# Patient Record
Sex: Female | Born: 1940 | Race: Black or African American | Hispanic: No | Marital: Married | Smoking: Never smoker
Health system: Southern US, Community
[De-identification: ages and names within clinical notes are randomized; demographics above are authoritative.]

## PROBLEM LIST (undated history)

## (undated) DIAGNOSIS — R42 Dizziness and giddiness: Secondary | ICD-10-CM

## (undated) DIAGNOSIS — E7800 Pure hypercholesterolemia, unspecified: Secondary | ICD-10-CM

## (undated) DIAGNOSIS — I639 Cerebral infarction, unspecified: Secondary | ICD-10-CM

## (undated) DIAGNOSIS — K219 Gastro-esophageal reflux disease without esophagitis: Secondary | ICD-10-CM

## (undated) DIAGNOSIS — I10 Essential (primary) hypertension: Secondary | ICD-10-CM

## (undated) HISTORY — PX: CHOLECYSTECTOMY: SHX55

## (undated) HISTORY — PX: ABDOMINAL HYSTERECTOMY: SHX81

## (undated) HISTORY — PX: LEG SURGERY: SHX1003

---

## 2003-08-11 ENCOUNTER — Encounter (INDEPENDENT_AMBULATORY_CARE_PROVIDER_SITE_OTHER): Payer: Self-pay | Admitting: Specialist

## 2003-08-11 ENCOUNTER — Inpatient Hospital Stay (HOSPITAL_COMMUNITY): Admission: EM | Admit: 2003-08-11 | Discharge: 2003-08-12 | Payer: Self-pay | Admitting: Emergency Medicine

## 2006-10-15 DIAGNOSIS — E7800 Pure hypercholesterolemia, unspecified: Secondary | ICD-10-CM | POA: Diagnosis present

## 2006-10-15 DIAGNOSIS — K219 Gastro-esophageal reflux disease without esophagitis: Secondary | ICD-10-CM | POA: Diagnosis present

## 2006-10-15 DIAGNOSIS — I10 Essential (primary) hypertension: Secondary | ICD-10-CM | POA: Diagnosis present

## 2007-09-22 ENCOUNTER — Emergency Department (HOSPITAL_COMMUNITY): Admission: EM | Admit: 2007-09-22 | Discharge: 2007-09-22 | Payer: Self-pay | Admitting: Emergency Medicine

## 2010-05-25 ENCOUNTER — Ambulatory Visit: Payer: Self-pay | Admitting: Pulmonary Disease

## 2010-05-25 ENCOUNTER — Inpatient Hospital Stay (HOSPITAL_COMMUNITY)
Admission: EM | Admit: 2010-05-25 | Discharge: 2010-05-30 | Payer: Self-pay | Source: Home / Self Care | Admitting: Emergency Medicine

## 2010-05-25 ENCOUNTER — Ambulatory Visit: Payer: Self-pay | Admitting: Cardiology

## 2010-05-26 ENCOUNTER — Encounter (INDEPENDENT_AMBULATORY_CARE_PROVIDER_SITE_OTHER): Payer: Self-pay | Admitting: Neurology

## 2010-05-26 ENCOUNTER — Ambulatory Visit: Payer: Self-pay | Admitting: Cardiology

## 2010-05-27 ENCOUNTER — Ambulatory Visit: Payer: Self-pay | Admitting: Physical Medicine & Rehabilitation

## 2010-05-27 ENCOUNTER — Encounter (INDEPENDENT_AMBULATORY_CARE_PROVIDER_SITE_OTHER): Payer: Self-pay | Admitting: Neurology

## 2010-06-06 ENCOUNTER — Encounter
Admission: RE | Admit: 2010-06-06 | Discharge: 2010-09-04 | Payer: Self-pay | Source: Home / Self Care | Admitting: Nurse Practitioner

## 2010-06-17 ENCOUNTER — Ambulatory Visit: Payer: Self-pay

## 2010-06-17 ENCOUNTER — Encounter: Payer: Self-pay | Admitting: Internal Medicine

## 2010-07-07 DIAGNOSIS — Z8673 Personal history of transient ischemic attack (TIA), and cerebral infarction without residual deficits: Secondary | ICD-10-CM

## 2010-11-08 NOTE — Procedures (Signed)
Summary: Summary Report  Summary Report   Imported By: Erle Crocker 07/19/2010 13:24:56  _____________________________________________________________________  External Attachment:    Type:   Image     Comment:   External Document

## 2010-12-22 LAB — URINALYSIS, ROUTINE W REFLEX MICROSCOPIC
Bilirubin Urine: NEGATIVE
Glucose, UA: NEGATIVE mg/dL
Hgb urine dipstick: NEGATIVE
Ketones, ur: NEGATIVE mg/dL
Nitrite: NEGATIVE
Protein, ur: NEGATIVE mg/dL
Specific Gravity, Urine: 1.01 (ref 1.005–1.030)
Urobilinogen, UA: 1 mg/dL (ref 0.0–1.0)
pH: 7 (ref 5.0–8.0)

## 2010-12-22 LAB — BASIC METABOLIC PANEL
BUN: 10 mg/dL (ref 6–23)
CO2: 24 mEq/L (ref 19–32)
CO2: 26 mEq/L (ref 19–32)
Calcium: 8.4 mg/dL (ref 8.4–10.5)
Chloride: 106 mEq/L (ref 96–112)
Creatinine, Ser: 1 mg/dL (ref 0.4–1.2)
GFR calc Af Amer: >60 mL/min (ref >60)
GFR calc non Af Amer: 55 mL/min — ABNORMAL LOW (ref >60)
GFR calc non Af Amer: >60 mL/min (ref >60)
Glucose, Bld: 110 mg/dL — ABNORMAL HIGH (ref 70–99)
Glucose, Bld: 139 mg/dL — ABNORMAL HIGH (ref 70–99)
Potassium: 3.5 mEq/L (ref 3.5–5.1)
Potassium: 4.1 mEq/L (ref 3.5–5.1)
Sodium: 137 mEq/L (ref 135–145)
Sodium: 140 mEq/L (ref 135–145)

## 2010-12-22 LAB — BLOOD GAS, ARTERIAL
Acid-base deficit: 1 mmol/L (ref 0.0–2.0)
Bicarbonate: 23.6 mEq/L (ref 20.0–24.0)
Drawn by: 31843
FIO2: 0.5 %
MECHVT: 500 mL
O2 Saturation: 98.9 %
PEEP: 5 cmH2O
Patient temperature: 97.6
RATE: 14 resp/min
TCO2: 24.9 mmol/L (ref 0–100)
pCO2 arterial: 41 mmHg (ref 35.0–45.0)
pH, Arterial: 7.375 (ref 7.350–7.400)
pO2, Arterial: 146 mmHg — ABNORMAL HIGH (ref 80.0–100.0)

## 2010-12-22 LAB — CBC
HCT: 29.7 % — ABNORMAL LOW (ref 36.0–46.0)
HCT: 31.7 % — ABNORMAL LOW (ref 36.0–46.0)
HCT: 38.6 % (ref 36.0–46.0)
Hemoglobin: 10.4 g/dL — ABNORMAL LOW (ref 12.0–15.0)
Hemoglobin: 13 g/dL (ref 12.0–15.0)
Hemoglobin: 9.7 g/dL — ABNORMAL LOW (ref 12.0–15.0)
MCH: 29.5 pg (ref 26.0–34.0)
MCH: 29.9 pg (ref 26.0–34.0)
MCHC: 32.7 g/dL (ref 30.0–36.0)
MCHC: 33.1 g/dL (ref 30.0–36.0)
MCHC: 33.7 g/dL (ref 30.0–36.0)
MCV: 88.7 fL (ref 78.0–100.0)
MCV: 89 fL (ref 78.0–100.0)
Platelets: 224 10*3/uL (ref 150–400)
Platelets: 286 10*3/uL (ref 150–400)
RBC: 3.36 MIL/uL — ABNORMAL LOW (ref 3.87–5.11)
RBC: 3.56 MIL/uL — ABNORMAL LOW (ref 3.87–5.11)
RBC: 4.35 MIL/uL (ref 3.87–5.11)
RDW: 12.5 % (ref 11.5–15.5)
RDW: 12.9 % (ref 11.5–15.5)
WBC: 10 10*3/uL (ref 4.0–10.5)
WBC: 8.2 10*3/uL (ref 4.0–10.5)

## 2010-12-22 LAB — TROPONIN I: Troponin I: 0.01 ng/mL (ref 0.00–0.06)

## 2010-12-22 LAB — COMPREHENSIVE METABOLIC PANEL
ALT: 16 U/L (ref 0–35)
AST: 24 U/L (ref 0–37)
Albumin: 4 g/dL (ref 3.5–5.2)
Alkaline Phosphatase: 84 U/L (ref 39–117)
BUN: 13 mg/dL (ref 6–23)
CO2: 25 mEq/L (ref 19–32)
Calcium: 9.1 mg/dL (ref 8.4–10.5)
Chloride: 103 mEq/L (ref 96–112)
Creatinine, Ser: 1.01 mg/dL (ref 0.4–1.2)
GFR calc Af Amer: >60 mL/min (ref >60)
GFR calc non Af Amer: 55 mL/min — ABNORMAL LOW (ref >60)
Glucose, Bld: 159 mg/dL — ABNORMAL HIGH (ref 70–99)
Potassium: 2.9 mEq/L — ABNORMAL LOW (ref 3.5–5.1)
Sodium: 136 mEq/L (ref 135–145)
Total Bilirubin: 0.5 mg/dL (ref 0.3–1.2)
Total Protein: 7.6 g/dL (ref 6.0–8.3)

## 2010-12-22 LAB — DIFFERENTIAL
Basophils Absolute: 0.1 10*3/uL (ref 0.0–0.1)
Basophils Relative: 1 % (ref 0–1)
Eosinophils Absolute: 0.1 10*3/uL (ref 0.0–0.7)
Eosinophils Relative: 1 % (ref 0–5)
Lymphocytes Relative: 48 % — ABNORMAL HIGH (ref 12–46)
Lymphs Abs: 4.8 10*3/uL — ABNORMAL HIGH (ref 0.7–4.0)
Monocytes Absolute: 0.8 10*3/uL (ref 0.1–1.0)
Monocytes Relative: 8 % (ref 3–12)
Neutro Abs: 4.3 10*3/uL (ref 1.7–7.7)
Neutrophils Relative %: 43 % (ref 43–77)

## 2010-12-22 LAB — PHOSPHORUS
Phosphorus: 2.4 mg/dL (ref 2.3–4.6)
Phosphorus: 3 mg/dL (ref 2.3–4.6)

## 2010-12-22 LAB — CK TOTAL AND CKMB (NOT AT ARMC)
CK, MB: 0.5 ng/mL (ref 0.3–4.0)
Relative Index: INVALID (ref 0.0–2.5)
Total CK: 45 U/L (ref 7–177)

## 2010-12-22 LAB — GLUCOSE, CAPILLARY
Glucose-Capillary: 102 mg/dL — ABNORMAL HIGH (ref 70–99)
Glucose-Capillary: 103 mg/dL — ABNORMAL HIGH (ref 70–99)
Glucose-Capillary: 106 mg/dL — ABNORMAL HIGH (ref 70–99)

## 2010-12-22 LAB — MRSA PCR SCREENING: MRSA by PCR: NEGATIVE

## 2010-12-22 LAB — LIPID PANEL
Cholesterol: 197 mg/dL (ref 0–200)
HDL: 39 mg/dL — ABNORMAL LOW (ref >39)
Triglycerides: 180 mg/dL — ABNORMAL HIGH (ref <150)
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
## 2011-02-24 NOTE — Op Note (Signed)
Chelsey Haas, Chelsey Haas                     ACCOUNT NO.:  [id number]   MEDICAL RECORD NO.:  [id number]                   PATIENT TYPE:  INP   LOCATION:  6707                                 FACILITY:  [facility]   PHYSICIAN:  Sandria Bales. Ezzard Standing, M.D.               DATE OF BIRTH:  1941/04/02   DATE OF PROCEDURE:  08/11/2003  DATE OF DISCHARGE:                                 OPERATIVE REPORT   PREOPERATIVE DIAGNOSIS:  Acute cholecystitis with cholelithiasis.   POSTOPERATIVE DIAGNOSIS:  Acute cholecystitis with cholelithiasis.   OPERATION PERFORMED:  Laparoscopic cholecystectomy with intraoperative  cholangiogram.   SURGEON:  Sandria Bales. Ezzard Standing, M.D.   ASSISTANT:  Gabrielle Dare. Janee Morn, M.D.   ANESTHESIA:  General endotracheal.   ESTIMATED BLOOD LOSS:  Minimal.   INDICATIONS FOR PROCEDURE:  Chelsey Haas is a 70 year old black female who  presented with acute abdominal pain in the right upper quadrant, ultrasound  consistent with gallstones and probable early acute cholecystitis.  Discussion carried out with the patient.  The indications and potential  complications of the procedure, potential complications including but not  limited to bleeding, infection, open surgery, bile leak, bowel injury.  The  patient now comes to the operating room where Chelsey underwent general  endotracheal anesthetic as supervised by Dr. Sheldon Silvan.  Chelsey was already  on Unasyn as an antibiotic.  Chelsey had PAS stockings in place.  Her abdomen  was prepped with Betadine solution and sterilely draped.   DESCRIPTION OF PROCEDURE:  An infraumbilical incision was made with sharp  dissection and carried down into the abdominal cavity.  A 0 degree 10 mm  laparoscope was inserted through a 12 mm Hasson trocar.  The Hasson trocar  was secured with a 0 Vicryl suture.  Abdominal exploration revealed some  Fitz-Hugh-Curtis adhesions over the liver and edematous thickened  gallbladder wall, the right and left lobes of the liver  were otherwise  unremarkable.  The anterior wall of the stomach was unremarkable.  Chelsey did  have some free fluid and there was bile stain along the right colonic  gutter.  There was no other mass or lesion noted.  Three additional trocars  were placed, a 10 mm Ethicon trocar in the subxiphoid location, a 5 mm  Ethicon trocar in the right midsubcostal and the 5 mm Ethicon trocar in the  right lateral subcostal location.   The gallbladder was first decompressed.  I aspirated out about 80mL out of  the distended edematous gallbladder.  I then rotated the gallbladder up,  took down adhesions on the gallbladder wall.  Even though her symptoms had  only been a little more than 24 hours, it looked like Chelsey had both a chronic  component and a more acute component of the gallbladder disease.  The cystic  duct was identified as was the cystic artery which was doubly clipped and  divided.  It looked like Chelsey had some stones  impacted in the cystic duct.  I  put a clip on the gallbladder side of the cystic duct.  I shot an  intraoperative cholangiogram.  The intraoperative cholangiogram was shot  using cut off taut catheter inserted through a 14 gauge Jelco catheter and  into the side of the cut cystic duct and secured with an Endo clip.   The taut catheter used half strength Hypaque solution, injected into the  cystic duct down the common bile duct into the duodenum.  There was no  filling defect.  The contrast went up the hepatic radicals down the common  bile duct into the duodenum.  A picture was taken as part of the chart.  I  then removed the taut catheter, I triply endoclipped the cystic duct and  then sharply and bluntly dissected the gallbladder from the gallbladder bed.  There were at least two large arterial branches coming from the gallbladder  bed which were clipped.  I did get into the gallbladder.  I retrieved  stones.  I placed the gallbladder when it was divided, into an EndoCatch   bag.  I then irrigated the abdomen out with 1L of saline.  I controlled  hemostasis with Bovie electrocautery.  I then delivered the gallbladder  through the umbilicus and sent it to pathology.  The umbilical port was  closed with 0 Vicryl suture.  Again the abdomen was irrigated with 1L of  saline.  There was no other problem with bleeding or bile leak within the  gallbladder or the gallbladder bed, the triangle of Calot well visualized.  Again there was no bleeding or bile leak.  Each trocar site was closed with  a 5-0 Vicryl suture, painted with tincture of benzoin and steri-stripped.  The patient then was transferred to the recovery room in good condition.  Sponge and needle counts were correct at the end of this case.                                               Sandria Bales. Ezzard Standing, M.D.    DHN/MEDQ  D:  08/11/2003  T:  08/11/2003  Job:  [id number]

## 2013-02-28 ENCOUNTER — Encounter (HOSPITAL_COMMUNITY): Payer: Self-pay | Admitting: *Deleted

## 2013-02-28 ENCOUNTER — Emergency Department (HOSPITAL_COMMUNITY)
Admission: EM | Admit: 2013-02-28 | Discharge: 2013-03-01 | Disposition: A | Discharge status: Home / Self Care | Payer: No Typology Code available for payment source | Attending: Emergency Medicine | Admitting: Emergency Medicine

## 2013-02-28 ENCOUNTER — Emergency Department (HOSPITAL_COMMUNITY): Payer: No Typology Code available for payment source

## 2013-02-28 DIAGNOSIS — I10 Essential (primary) hypertension: Secondary | ICD-10-CM | POA: Insufficient documentation

## 2013-02-28 DIAGNOSIS — E7800 Pure hypercholesterolemia, unspecified: Secondary | ICD-10-CM | POA: Insufficient documentation

## 2013-02-28 DIAGNOSIS — Z8673 Personal history of transient ischemic attack (TIA), and cerebral infarction without residual deficits: Secondary | ICD-10-CM | POA: Insufficient documentation

## 2013-02-28 DIAGNOSIS — R42 Dizziness and giddiness: Secondary | ICD-10-CM | POA: Insufficient documentation

## 2013-02-28 DIAGNOSIS — Z79899 Other long term (current) drug therapy: Secondary | ICD-10-CM | POA: Insufficient documentation

## 2013-02-28 DIAGNOSIS — K219 Gastro-esophageal reflux disease without esophagitis: Secondary | ICD-10-CM | POA: Insufficient documentation

## 2013-02-28 DIAGNOSIS — R51 Headache: Secondary | ICD-10-CM | POA: Insufficient documentation

## 2013-02-28 HISTORY — DX: Cerebral infarction, unspecified: I63.9

## 2013-02-28 HISTORY — DX: Pure hypercholesterolemia, unspecified: E78.00

## 2013-02-28 HISTORY — DX: Dizziness and giddiness: R42

## 2013-02-28 HISTORY — DX: Essential (primary) hypertension: I10

## 2013-02-28 HISTORY — DX: Gastro-esophageal reflux disease without esophagitis: K21.9

## 2013-02-28 LAB — COMPREHENSIVE METABOLIC PANEL
AST: 16 U/L (ref 0–37)
CO2: 26 mEq/L (ref 19–32)
Calcium: 9.6 mg/dL (ref 8.4–10.5)
Creatinine, Ser: 0.98 mg/dL (ref 0.50–1.10)
GFR calc Af Amer: 67 mL/min — ABNORMAL LOW (ref >90)
GFR calc non Af Amer: 58 mL/min — ABNORMAL LOW (ref >90)

## 2013-02-28 LAB — POCT I-STAT TROPONIN I: Troponin i, poc: 0 ng/mL (ref 0.00–0.08)

## 2013-02-28 LAB — CBC
HCT: 36.6 % (ref 36.0–46.0)
MCH: 30.4 pg (ref 26.0–34.0)
MCHC: 33.9 g/dL (ref 30.0–36.0)
MCV: 89.7 fL (ref 78.0–100.0)
RDW: 12.4 % (ref 11.5–15.5)

## 2013-02-28 LAB — DIFFERENTIAL
Basophils Absolute: 0 10*3/uL (ref 0.0–0.1)
Eosinophils Relative: 1 % (ref 0–5)
Lymphocytes Relative: 33 % (ref 12–46)
Monocytes Absolute: 0.6 10*3/uL (ref 0.1–1.0)

## 2013-02-28 LAB — APTT: aPTT: 31 seconds (ref 24–37)

## 2013-02-28 LAB — POCT I-STAT, CHEM 8
Calcium, Ion: 1.24 mmol/L (ref 1.13–1.30)
Chloride: 100 mEq/L (ref 96–112)
HCT: 38 % (ref 36.0–46.0)
Hemoglobin: 12.9 g/dL (ref 12.0–15.0)
TCO2: 31 mmol/L (ref 0–100)

## 2013-02-28 LAB — PROTIME-INR
INR: 0.96 (ref 0.00–1.49)
Prothrombin Time: 12.7 seconds (ref 11.6–15.2)

## 2013-02-28 MED ORDER — METOCLOPRAMIDE HCL 5 MG/ML IJ SOLN
10.0000 mg | Freq: Once | INTRAMUSCULAR | Status: AC
  Administered 2013-02-28: 10 mg via INTRAVENOUS
  Filled 2013-02-28: qty 2

## 2013-02-28 MED ORDER — MECLIZINE HCL 25 MG PO TABS: 25.0000 mg | ORAL_TABLET | Freq: Four times a day (QID) | ORAL | Status: DC | PRN

## 2013-02-28 NOTE — ED Notes (Signed)
pts jewlery taken off and given to husband

## 2013-02-28 NOTE — ED Provider Notes (Signed)
History     CSN: [id number]  Arrival date & time 02/28/13  1723   First MD Initiated Contact with Patient 02/28/13 2131      Chief Complaint  Patient presents with  . Dizziness  . Headache    (Consider location/radiation/quality/duration/timing/severity/associated sxs/prior treatment) HPI This 72 year old female has a history of prior stroke with residual mild expressive aphasia, she's history of intermittent vertigo in the past as well, she was last known well 6 days ago and 5 days ago woke up with intermittent positional vertigo lasting seconds to minutes at a time, she is able to walk unassisted but has an unsteady gait at times over the last 5 days, she has had mild gradual intermittent dull ache type headache which lasts hours at a time but no sudden or severe headache, she is no change in hearing and no change in speech vision swallowing or understanding, there is no change in her baseline mild expressive aphasia, she has no focal or lateralizing weakness numbness or incoordination and is able to walk unassisted but feels as if her ears gait is unsteady at times when she has vertigo with position changes when she starts walking, at times however she also is a vertigo feeling just if she closes her eyes and lies still she will occasionally still feel as if her head is moving when she knows it is being held still, there is no fever no trauma no chest pain no palpitations no shortness breath no lightheadedness no syncope no trauma no abdominal pain or bloody stools and no treatment prior to arrival, since her symptoms have not resolved in the last several days she came to the ED for evaluation. Past Medical History  Diagnosis Date  . Stroke   . Vertigo   . Hypertension   . High cholesterol   . Acid reflux     Past Surgical History  Procedure Laterality Date  . Leg surgery    . Abdominal hysterectomy    . Cholecystectomy      History reviewed. No pertinent family  history.  History  Substance Use Topics  . Smoking status: Not on file  . Smokeless tobacco: Not on file  . Alcohol Use: No    OB History   Grav Para Term Preterm Abortions TAB SAB Ect Mult Living                  Review of Systems 10 Systems reviewed and are negative for acute change except as noted in the HPI. Allergies  Review of patient's allergies indicates no known allergies.  Home Medications   Current Outpatient Rx  Name  Route  Sig  Dispense  Refill  . clopidogrel (PLAVIX) 75 MG tablet   Oral   Take 75 mg by mouth daily.         Marland Kitchen ibuprofen (ADVIL,MOTRIN) 200 MG tablet   Oral   Take 400 mg by mouth every 6 (six) hours as needed for pain.         Marland Kitchen lisinopril-hydrochlorothiazide (PRINZIDE,ZESTORETIC) 20-25 MG per tablet   Oral   Take 1 tablet by mouth daily.         . Multiple Vitamins-Minerals (MULTIVITAMIN PO)   Oral   Take 0.5 tablets by mouth daily.         . pantoprazole (PROTONIX) 40 MG tablet   Oral   Take 40 mg by mouth daily.         . simvastatin (ZOCOR) 40 MG tablet  Oral   Take 40 mg by mouth every evening.         . meclizine (ANTIVERT) 25 MG tablet   Oral   Take 1 tablet (25 mg total) by mouth every 6 (six) hours as needed for dizziness.   20 tablet   0     BP 116/68  Pulse 69  Temp(Src) 98.6 F (37 C) (Oral)  Resp 20  SpO2 100%  Physical Exam  Nursing note and vitals reviewed. Constitutional:  Awake, alert, nontoxic appearance with baseline speech for patient.  HENT:  Head: Atraumatic.  Mouth/Throat: No oropharyngeal exudate.  Tympanic membranes clear bilaterally  Eyes: EOM are normal. Pupils are equal, round, and reactive to light. Right eye exhibits no discharge. Left eye exhibits no discharge.  Only a few beats of intermittent lateral nystagmus noted no multidirectional nystagmus noted no rotary nystagmus noted no vertical nystagmus noted and she has negative test of skew  Neck: Neck supple.   Cardiovascular: Normal rate and regular rhythm.   No murmur heard. Pulmonary/Chest: Effort normal and breath sounds normal. No stridor. No respiratory distress. She has no wheezes. She has no rales. She exhibits no tenderness.  Abdominal: Soft. Bowel sounds are normal. She exhibits no mass. There is no tenderness. There is no rebound.  Musculoskeletal: She exhibits no tenderness.  Baseline ROM, moves extremities with no obvious new focal weakness.  Lymphadenopathy:    She has no cervical adenopathy.  Neurological: She is alert.  Awake, alert, cooperative and aware of situation; motor strength bilaterally; sensation normal to light touch bilaterally; peripheral visual fields full to confrontation; no facial asymmetry; tongue midline; major cranial nerves appear intact; no pronator drift, normal finger to nose bilaterally, gait is slow and somewhat shuffling and the patient subjectively feels unsteady however there is no obvious severe ataxia but she states it is not her normal gait  Skin: No rash noted.  Psychiatric: She has a normal mood and affect.    ED Course  Procedures (including critical care time) In the ED she has intermittent episodes of vertigo even while lying still and also has subjective vertigo with some body position changes without nystagmus noted at other times only has some minimal lateral nystagmus noted with head position changes with some intermittent vertigo, it is difficult to differentiate central versus peripheral vertigo, I suspect more likely peripheral vertigo, however since several days of ongoing symptoms with symptoms even at rest without movement I believe MRI is reasonable. Labs Reviewed  COMPREHENSIVE METABOLIC PANEL - Abnormal; Notable for the following:    Glucose, Bld 120 (*)    GFR calc non Af Amer 58 (*)    GFR calc Af Amer 67 (*)    All other components within normal limits  POCT I-STAT, CHEM 8 - Abnormal; Notable for the following:    Glucose, Bld  119 (*)    All other components within normal limits  PROTIME-INR  APTT  CBC  DIFFERENTIAL  TROPONIN I  POCT I-STAT TROPONIN I   No results found.   1. Vertigo       MDM  Pt feels improved after observation and/or treatment in ED.Patient / Family / Caregiver informed of clinical course, understand medical decision-making process, and agree with plan.I doubt any other EMC precluding discharge at this time including, but not necessarily limited to the following:CVA.        Hurman Horn, MD 03/03/13 [phone]

## 2013-02-28 NOTE — ED Notes (Signed)
Pt reports onset of dizziness 5 days ago with mild headache. Reports blurred vision x 3 days. Reports dizziness is positional, occurs mainly first thing in am when she gets out of bed and when she bends down. Hx of vertigo and this feels similar. No acute distress noted at triage.

## 2013-02-28 NOTE — ED Notes (Signed)
Initial blood work drawn by [facility] not [facility].

## 2014-12-10 ENCOUNTER — Emergency Department (HOSPITAL_COMMUNITY): Payer: No Typology Code available for payment source

## 2014-12-10 ENCOUNTER — Emergency Department (HOSPITAL_COMMUNITY)
Admission: EM | Admit: 2014-12-10 | Discharge: 2014-12-10 | Disposition: A | Discharge status: Home / Self Care | Payer: No Typology Code available for payment source | Attending: Emergency Medicine | Admitting: Emergency Medicine

## 2014-12-10 ENCOUNTER — Encounter (HOSPITAL_COMMUNITY): Payer: Self-pay | Admitting: *Deleted

## 2014-12-10 DIAGNOSIS — E780 Pure hypercholesterolemia: Secondary | ICD-10-CM | POA: Diagnosis not present

## 2014-12-10 DIAGNOSIS — K219 Gastro-esophageal reflux disease without esophagitis: Secondary | ICD-10-CM | POA: Insufficient documentation

## 2014-12-10 DIAGNOSIS — I10 Essential (primary) hypertension: Secondary | ICD-10-CM | POA: Insufficient documentation

## 2014-12-10 DIAGNOSIS — Z79899 Other long term (current) drug therapy: Secondary | ICD-10-CM | POA: Insufficient documentation

## 2014-12-10 DIAGNOSIS — Z7902 Long term (current) use of antithrombotics/antiplatelets: Secondary | ICD-10-CM | POA: Insufficient documentation

## 2014-12-10 DIAGNOSIS — H539 Unspecified visual disturbance: Secondary | ICD-10-CM

## 2014-12-10 DIAGNOSIS — R4701 Aphasia: Secondary | ICD-10-CM | POA: Diagnosis not present

## 2014-12-10 DIAGNOSIS — R51 Headache: Secondary | ICD-10-CM | POA: Insufficient documentation

## 2014-12-10 DIAGNOSIS — H532 Diplopia: Secondary | ICD-10-CM | POA: Diagnosis not present

## 2014-12-10 DIAGNOSIS — H538 Other visual disturbances: Secondary | ICD-10-CM | POA: Insufficient documentation

## 2014-12-10 DIAGNOSIS — Z8673 Personal history of transient ischemic attack (TIA), and cerebral infarction without residual deficits: Secondary | ICD-10-CM | POA: Insufficient documentation

## 2014-12-10 LAB — I-STAT CHEM 8, ED
BUN: 16 mg/dL (ref 6–23)
CALCIUM ION: 1.28 mmol/L (ref 1.13–1.30)
Chloride: 100 mmol/L (ref 96–112)
Creatinine, Ser: 1.3 mg/dL — ABNORMAL HIGH (ref 0.50–1.10)
GLUCOSE: 105 mg/dL — AB (ref 70–99)
HCT: 40 % (ref 36.0–46.0)
Hemoglobin: 13.6 g/dL (ref 12.0–15.0)
Potassium: 3.9 mmol/L (ref 3.5–5.1)
Sodium: 140 mmol/L (ref 135–145)
TCO2: 26 mmol/L (ref 0–100)

## 2014-12-10 LAB — COMPREHENSIVE METABOLIC PANEL
ALK PHOS: 60 U/L (ref 39–117)
ALT: 14 U/L (ref 0–35)
AST: 20 U/L (ref 0–37)
Albumin: 4.2 g/dL (ref 3.5–5.2)
Anion gap: 8 (ref 5–15)
BUN: 12 mg/dL (ref 6–23)
CHLORIDE: 101 mmol/L (ref 96–112)
CO2: 31 mmol/L (ref 19–32)
Calcium: 9.9 mg/dL (ref 8.4–10.5)
Creatinine, Ser: 1.25 mg/dL — ABNORMAL HIGH (ref 0.50–1.10)
GFR, EST AFRICAN AMERICAN: 49 mL/min — AB (ref >90)
GFR, EST NON AFRICAN AMERICAN: 42 mL/min — AB (ref >90)
GLUCOSE: 109 mg/dL — AB (ref 70–99)
POTASSIUM: 3.9 mmol/L (ref 3.5–5.1)
SODIUM: 140 mmol/L (ref 135–145)
Total Bilirubin: 0.8 mg/dL (ref 0.3–1.2)
Total Protein: 7.2 g/dL (ref 6.0–8.3)

## 2014-12-10 LAB — DIFFERENTIAL
Basophils Absolute: 0 10*3/uL (ref 0.0–0.1)
Basophils Relative: 1 % (ref 0–1)
Eosinophils Absolute: 0.1 10*3/uL (ref 0.0–0.7)
Eosinophils Relative: 1 % (ref 0–5)
LYMPHS ABS: 1.7 10*3/uL (ref 0.7–4.0)
LYMPHS PCT: 39 % (ref 12–46)
MONOS PCT: 10 % (ref 3–12)
Monocytes Absolute: 0.4 10*3/uL (ref 0.1–1.0)
NEUTROS ABS: 2.2 10*3/uL (ref 1.7–7.7)
NEUTROS PCT: 49 % (ref 43–77)

## 2014-12-10 LAB — RAPID URINE DRUG SCREEN, HOSP PERFORMED
AMPHETAMINES: NOT DETECTED
BARBITURATES: NOT DETECTED
Benzodiazepines: NOT DETECTED
Cocaine: NOT DETECTED
OPIATES: NOT DETECTED
TETRAHYDROCANNABINOL: NOT DETECTED

## 2014-12-10 LAB — URINALYSIS, ROUTINE W REFLEX MICROSCOPIC
BILIRUBIN URINE: NEGATIVE
Glucose, UA: NEGATIVE mg/dL
Hgb urine dipstick: NEGATIVE
Ketones, ur: NEGATIVE mg/dL
Nitrite: NEGATIVE
Protein, ur: NEGATIVE mg/dL
SPECIFIC GRAVITY, URINE: 1.013 (ref 1.005–1.030)
Urobilinogen, UA: 0.2 mg/dL (ref 0.0–1.0)
pH: 7.5 (ref 5.0–8.0)

## 2014-12-10 LAB — CBC
HEMATOCRIT: 37.3 % (ref 36.0–46.0)
HEMOGLOBIN: 12.4 g/dL (ref 12.0–15.0)
MCH: 29.7 pg (ref 26.0–34.0)
MCHC: 33.2 g/dL (ref 30.0–36.0)
MCV: 89.2 fL (ref 78.0–100.0)
PLATELETS: 266 10*3/uL (ref 150–400)
RBC: 4.18 MIL/uL (ref 3.87–5.11)
RDW: 12.6 % (ref 11.5–15.5)
WBC: 4.4 10*3/uL (ref 4.0–10.5)

## 2014-12-10 LAB — APTT: APTT: 32 s (ref 24–37)

## 2014-12-10 LAB — PROTIME-INR
INR: 1.02 (ref 0.00–1.49)
PROTHROMBIN TIME: 13.5 s (ref 11.6–15.2)

## 2014-12-10 LAB — I-STAT TROPONIN, ED: TROPONIN I, POC: 0 ng/mL (ref 0.00–0.08)

## 2014-12-10 LAB — URINE MICROSCOPIC-ADD ON

## 2014-12-10 LAB — ETHANOL

## 2014-12-10 NOTE — ED Notes (Signed)
Pt in MRI.

## 2014-12-10 NOTE — ED Notes (Addendum)
Pt in episode of double vision that occurred last night, pt states she went to sleep and when she woke up this morning her vision had improved, states it is still not at baseline, pt states things close to her are blurrier than normal but clear at distances, pt also reports intermittent headaches over the last few weeks, also hearing changes in her right ear

## 2014-12-10 NOTE — ED Provider Notes (Addendum)
CSN: [id number]     Arrival date & time 12/10/14  1319 History   First MD Initiated Contact with Patient 12/10/14 1500     Chief Complaint  Patient presents with  . Visual Field Change     (Consider location/radiation/quality/duration/timing/severity/associated sxs/prior Treatment) Patient is a 74 y.o. female presenting with eye problem. The history is provided by the patient.  Eye Problem Location:  Both Quality: double vision. Severity:  Severe Onset quality:  Sudden Duration:  10 minutes Timing:  Constant Progression:  Partially resolved Chronicity:  New Context comment:  Was getting her hair done and abruptly developed double vision that has mostly resolved but states her eyes still are not quite right but hard to explain Relieved by:  Nothing Worsened by:  Nothing tried Ineffective treatments:  None tried Associated symptoms: blurred vision, double vision and headaches   Associated symptoms: no decreased vision, no nausea, no numbness, no photophobia, no scotomas, no tingling, no vomiting and no weakness   Associated symptoms comment:  Decreased hearing in the right ear Headaches:    Severity:  Mild   Onset quality:  Gradual   Duration:  3 months   Timing:  Intermittent   Progression since onset: states has headaches about 3 times a weak.  always on the left parietal area. Risk factors comment:  Hx of stroke s/p tPA and persistent aphasia   Past Medical History  Diagnosis Date  . Stroke   . Vertigo   . Hypertension   . High cholesterol   . Acid reflux    Past Surgical History  Procedure Laterality Date  . Leg surgery    . Abdominal hysterectomy    . Cholecystectomy     History reviewed. No pertinent family history. History  Substance Use Topics  . Smoking status: Not on file  . Smokeless tobacco: Not on file  . Alcohol Use: No   OB History    No data available     Review of Systems  Eyes: Positive for blurred vision and double vision. Negative for  photophobia.  Gastrointestinal: Negative for nausea and vomiting.  Neurological: Positive for headaches. Negative for tingling, weakness and numbness.  All other systems reviewed and are negative.     Allergies  Review of patient's allergies indicates no known allergies.  Home Medications   Prior to Admission medications   Medication Sig Start Date End Date Taking? Authorizing Provider  acetaminophen (TYLENOL) 500 MG tablet Take 1,000 mg by mouth every 6 (six) hours as needed.   Yes Historical Provider, MD  Calcium Carbonate-Vitamin D (CALCIUM + D PO) Take 1 tablet by mouth every evening.   Yes Historical Provider, MD  clopidogrel (PLAVIX) 75 MG tablet Take 75 mg by mouth daily.   Yes Historical Provider, MD  lisinopril-hydrochlorothiazide (PRINZIDE,ZESTORETIC) 20-25 MG per tablet Take 1 tablet by mouth daily.   Yes Historical Provider, MD  Multiple Vitamins-Minerals (MULTIVITAMIN PO) Take 0.5 tablets by mouth daily.   Yes Historical Provider, MD  pantoprazole (PROTONIX) 40 MG tablet Take 40 mg by mouth daily.   Yes Historical Provider, MD  simvastatin (ZOCOR) 40 MG tablet Take 40 mg by mouth every evening.   Yes Historical Provider, MD  meclizine (ANTIVERT) 25 MG tablet Take 1 tablet (25 mg total) by mouth every 6 (six) hours as needed for dizziness. Patient not taking: Reported on 12/10/2014 02/28/13   Hurman Horn, MD   BP 121/67 mmHg  Pulse 74  Temp(Src) 98.1 F (36.7 C) (Oral)  Resp 14  Ht 5\' 3"  (1.6 m)  Wt 170 lb (77.111 kg)  BMI 30.12 kg/m2  SpO2 97% Physical Exam  Constitutional: She is oriented to person, place, and time. She appears well-developed and well-nourished. No distress.  HENT:  Head: Normocephalic and atraumatic.  Mouth/Throat: Oropharynx is clear and moist.  Eyes: Conjunctivae and EOM are normal. Pupils are equal, round, and reactive to light.  Neck: Normal range of motion. Neck supple.  Cardiovascular: Normal rate, regular rhythm and intact distal pulses.    No murmur heard. Pulmonary/Chest: Effort normal and breath sounds normal. No respiratory distress. She has no wheezes. She has no rales.  Abdominal: Soft. She exhibits no distension. There is no tenderness. There is no rebound and no guarding.  Musculoskeletal: Normal range of motion. She exhibits no edema or tenderness.  Neurological: She is alert and oriented to person, place, and time. She has normal strength. No cranial nerve deficit or sensory deficit. Coordination and gait normal.  Mild expressive aphasia.  No visual field cuts.  Normal finger to nose  Skin: Skin is warm and dry. No rash noted. No erythema.  Psychiatric: She has a normal mood and affect. Her behavior is normal.  Nursing note and vitals reviewed.   ED Course  Procedures (including critical care time) Labs Review Labs Reviewed  COMPREHENSIVE METABOLIC PANEL - Abnormal; Notable for the following:    Glucose, Bld 109 (*)    Creatinine, Ser 1.25 (*)    GFR calc non Af Amer 42 (*)    GFR calc Af Amer 49 (*)    All other components within normal limits  URINALYSIS, ROUTINE W REFLEX MICROSCOPIC - Abnormal; Notable for the following:    Leukocytes, UA TRACE (*)    All other components within normal limits  I-STAT CHEM 8, ED - Abnormal; Notable for the following:    Creatinine, Ser 1.30 (*)    Glucose, Bld 105 (*)    All other components within normal limits  ETHANOL  PROTIME-INR  APTT  CBC  DIFFERENTIAL  URINE RAPID DRUG SCREEN (HOSP PERFORMED)  URINE MICROSCOPIC-ADD ON  I-STAT TROPOININ, ED    Imaging Review Ct Head Wo Contrast  12/10/2014   CLINICAL DATA:  Double vision last night. Improving this morning. Intermittent headaches.  EXAM: CT HEAD WITHOUT CONTRAST  TECHNIQUE: Contiguous axial images were obtained from the base of the skull through the vertex without intravenous contrast.  COMPARISON:  02/28/2013-MRI Brain  FINDINGS: There is no evidence of mass effect, midline shift or extra-axial fluid  collections. There is no evidence of a space-occupying lesion or intracranial hemorrhage. There is no evidence of a cortical-based area of acute infarction. There is an old left insular infarct with encephalomalacia.  There is mild ex vacuo dilatation of the left lateral ventricle. The ventricles and sulci are otherwise appropriate for the patient's age. The basal cisterns are patent.  Visualized portions of the orbits are unremarkable. The visualized portions of the paranasal sinuses and mastoid air cells are unremarkable.  The osseous structures are unremarkable.  IMPRESSION: 1. No acute intracranial pathology. 2. Old, left MCA territory infarct with encephalomalacia.   Electronically Signed   By: Elige KoHetal  Patel   On: 12/10/2014 17:06   Mr Brain Wo Contrast  12/10/2014   CLINICAL DATA:  Visual field change. Episode of diplopia last evening. Her vision was improved after sleeping overnight. The vision has not completely resolved. Intermittent headaches over the last 2 weeks.  EXAM: MRI HEAD WITHOUT CONTRAST  TECHNIQUE: Multiplanar, multiecho pulse sequences of the brain and surrounding structures were obtained without intravenous contrast.  COMPARISON:  CT head without contrast from the same day. MRI brain 02/28/2013.  FINDINGS: A remote left MCA territory infarct involves the left insula, external capsule, lateral frontal and parietal lobes. There is ex vacuo dilation of the left lateral ventricle.  No acute infarct, hemorrhage, or mass lesion is present. Insert pass fluid The ventricles are of normal size.  Remote lacunar infarcts are evident within the cerebellum bilaterally.  Flow is present in the major intracranial arteries. The globes and orbits are intact. The paranasal sinuses and mastoid air cells are clear. The skullbase is intact.  IMPRESSION: 1. No acute intracranial abnormality. 2. Remote left MCA territory infarct. 3. Remote lacunar infarcts within the cerebellum bilaterally.   Electronically Signed    By: Marin Roberts M.D.   On: 12/10/2014 19:20     EKG Interpretation None      MDM   Final diagnoses:  Vision changes    Patient presenting with a 5-10 minute episode of vertical diplopia yesterday. She states the diplopia has resolved but she still has difficulty focusing with her vision. She is not entirely sure that the double vision has completely resolved. She has a prior history 3 years ago of stroke and struggles with persistent a facia. Also she noted today decreased hearing from her right ear. She denies any unilateral weakness or numbness. She has no tenderness over her temples concerning for temporal arteritis. She has intermittently for the last 3 months had a left-sided headache which occurs maybe 3 times a week and is mild. She continues to take Plavix which she was started on after her prior stroke. She denies any recent medication changes. Here she is hemodynamically stable with a normal blood pressure. Stroke labs were initiated and have all thus far been normal. Head CT pending  5:27 PM Ct neg for acute findings.  Discussed pt with Dr. Amada Jupiter who recommended an MRI.  If that is normal feel pt would be able to go home with ophthalmology f/u.  7:28 PM MRI without acute findings.  Pt already has f/u with her opho  Gwyneth Sprout, MD 12/10/14 1929  Gwyneth Sprout, MD 12/10/14 4782

## 2014-12-10 NOTE — Discharge Instructions (Signed)
Blurred Vision °You have been seen today complaining of blurred vision. This means you have a loss of ability to see small details.  °CAUSES  °Blurred vision can be a symptom of underlying eye problems, such as: °· Aging of the eye (presbyopia). °· Glaucoma. °· Cataracts. °· Eye infection. °· Eye-related migraine. °· Diabetes mellitus. °· Fatigue. °· Migraine headaches. °· High blood pressure. °· Breakdown of the back of the eye (macular degeneration). °· Problems caused by some medications. °The most common cause of blurred vision is the need for eyeglasses or a new prescription. Today in the emergency department, no cause for your blurred vision can be found. °SYMPTOMS  °Blurred vision is the loss of visual sharpness and detail (acuity). °DIAGNOSIS  °Should blurred vision continue, you should see your caregiver. If your caregiver is your primary care physician, he or she may choose to refer you to another specialist.  °TREATMENT  °Do not ignore your blurred vision. Make sure to have it checked out to see if further treatment or referral is necessary. °SEEK MEDICAL CARE IF:  °You are unable to get into a specialist so we can help you with a referral. °SEEK IMMEDIATE MEDICAL CARE IF: °You have severe eye pain, severe headache, or sudden loss of vision. °MAKE SURE YOU:  °· Understand these instructions. °· Will watch your condition. °· Will get help right away if you are not doing well or get worse. °Document Released: 09/28/2003 Document Revised: 12/18/2011 Document Reviewed: 04/29/2008 °[employer]® Patient Information ©2015 [employer]. This information is not intended to replace advice given to you by your health care provider. Make sure you discuss any questions you have with your health care provider. ° °

## 2014-12-10 NOTE — ED Notes (Signed)
Patient away in MRI upon rounding.

## 2016-03-18 ENCOUNTER — Encounter (HOSPITAL_COMMUNITY): Payer: Self-pay | Admitting: *Deleted

## 2016-03-18 ENCOUNTER — Emergency Department (HOSPITAL_COMMUNITY)
Admission: EM | Admit: 2016-03-18 | Discharge: 2016-03-18 | Disposition: A | Discharge status: Home / Self Care | Payer: Medicare Other | Attending: Emergency Medicine | Admitting: Emergency Medicine

## 2016-03-18 DIAGNOSIS — I10 Essential (primary) hypertension: Secondary | ICD-10-CM | POA: Diagnosis not present

## 2016-03-18 DIAGNOSIS — Z7901 Long term (current) use of anticoagulants: Secondary | ICD-10-CM | POA: Diagnosis not present

## 2016-03-18 DIAGNOSIS — M26622 Arthralgia of left temporomandibular joint: Secondary | ICD-10-CM | POA: Insufficient documentation

## 2016-03-18 DIAGNOSIS — H9202 Otalgia, left ear: Secondary | ICD-10-CM | POA: Diagnosis present

## 2016-03-18 DIAGNOSIS — Z8673 Personal history of transient ischemic attack (TIA), and cerebral infarction without residual deficits: Secondary | ICD-10-CM | POA: Diagnosis not present

## 2016-03-18 NOTE — ED Provider Notes (Signed)
CSN: [id number]     Arrival date & time 03/18/16  1254 History   First MD Initiated Contact with Patient 03/18/16 1320     Chief Complaint  Patient presents with  . Otalgia     (Consider location/radiation/quality/duration/timing/severity/associated sxs/prior Treatment) HPI Chelsey Haas is a 75 y.o. female with hx of Vertigo, HTN, REFLUX, and CVA presents to the ED with left ear pain and left side facial pain that has been off and on x 2 weeks. She reports having had problems in the past but this time seems worse. She has had a little runny nose but no cough, congestion, fever or chills. She denies any other problems.   Past Medical History  Diagnosis Date  . Stroke (HCC)   . Vertigo   . Hypertension   . High cholesterol   . Acid reflux    Past Surgical History  Procedure Laterality Date  . Leg surgery    . Abdominal hysterectomy    . Cholecystectomy     History reviewed. No pertinent family history. Social History  Substance Use Topics  . Smoking status: Never Smoker   . Smokeless tobacco: None  . Alcohol Use: No   OB History    No data available     Review of Systems Negative except as stated in HPI   Allergies  Review of patient's allergies indicates no known allergies.  Home Medications   Prior to Admission medications   Medication Sig Start Date End Date Taking? Authorizing Provider  acetaminophen (TYLENOL) 500 MG tablet Take 1,000 mg by mouth every 6 (six) hours as needed.    Historical Provider, MD  Calcium Carbonate-Vitamin D (CALCIUM + D PO) Take 1 tablet by mouth every evening.    Historical Provider, MD  clopidogrel (PLAVIX) 75 MG tablet Take 75 mg by mouth daily.    Historical Provider, MD  lisinopril-hydrochlorothiazide (PRINZIDE,ZESTORETIC) 20-25 MG per tablet Take 1 tablet by mouth daily.    Historical Provider, MD  meclizine (ANTIVERT) 25 MG tablet Take 1 tablet (25 mg total) by mouth every 6 (six) hours as needed for dizziness. Patient not  taking: Reported on 12/10/2014 02/28/13   Wayland SalinasJohn Bednar, MD  Multiple Vitamins-Minerals (MULTIVITAMIN PO) Take 0.5 tablets by mouth daily.    Historical Provider, MD  pantoprazole (PROTONIX) 40 MG tablet Take 40 mg by mouth daily.    Historical Provider, MD  simvastatin (ZOCOR) 40 MG tablet Take 40 mg by mouth every evening.    Historical Provider, MD   BP 135/76 mmHg  Pulse 72  Temp(Src) 98.5 F (36.9 C) (Oral)  Resp 16  Ht 5\' 3"  (1.6 m)  Wt 76.658 kg  BMI 29.94 kg/m2  SpO2 98% Physical Exam  Constitutional: She is oriented to person, place, and time. She appears well-developed and well-nourished. No distress.  HENT:  Head: Normocephalic.  Right Ear: Tympanic membrane normal.  Left Ear: Tympanic membrane normal.  Mouth/Throat: Uvula is midline, oropharynx is clear and moist and mucous membranes are normal.  Nasal drainage. There is tenderness with palpation of the pre auricular area and there is a pop at the TMJ when patient opens her mouth wide.    Eyes: Conjunctivae and EOM are normal. Pupils are equal, round, and reactive to light.  Neck: Normal range of motion. Neck supple.  Cardiovascular: Normal rate and regular rhythm.   Pulmonary/Chest: Effort normal and breath sounds normal.  Abdominal: Soft. There is no tenderness.  Musculoskeletal: Normal range of motion.  Lymphadenopathy:  She has no cervical adenopathy.  Neurological: She is alert and oriented to person, place, and time. No cranial nerve deficit.  Skin: Skin is warm and dry.  Psychiatric: She has a normal mood and affect. Her behavior is normal.  Nursing note and vitals reviewed.   ED Course  Procedures (including critical care time) Labs Review Labs Reviewed - No data to display  Dr. Clydene Pugh in to examine the patient and agrees with assessment and plan. He discussed findings with the patient.  MDM  75 y.o. female with left ear pain and left facial pain stable for d/c without otitis media. 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 She will return here as needed.   Final diagnoses:  Arthralgia of left temporomandibular joint       Janne Napoleon, NP 03/18/16 1421  Lyndal Pulley, MD 03/18/16 4098

## 2016-03-18 NOTE — ED Notes (Signed)
C/o left earache x 2 weeks. States she has had frequent earaches the past 2 weeks.

## 2016-03-18 NOTE — ED Notes (Signed)
Pt reports having left ear pain x 2-3 weeks, described as throbbing pain. Denies fever. No acute distress noted at triage. No relief with otc ear drops.

## 2016-03-18 NOTE — Discharge Instructions (Signed)
You can tylenol arthritis or advil as needed for pain. Follow up with your dentist to discuss other treatment.

## 2018-12-23 ENCOUNTER — Emergency Department (HOSPITAL_COMMUNITY): Payer: PRIVATE HEALTH INSURANCE

## 2018-12-23 ENCOUNTER — Emergency Department (HOSPITAL_COMMUNITY)
Admission: EM | Admit: 2018-12-23 | Discharge: 2018-12-23 | Discharge status: Against Medical Advice | Payer: PRIVATE HEALTH INSURANCE | Attending: Emergency Medicine | Admitting: Emergency Medicine

## 2018-12-23 ENCOUNTER — Encounter (HOSPITAL_COMMUNITY): Payer: Self-pay | Admitting: Emergency Medicine

## 2018-12-23 ENCOUNTER — Other Ambulatory Visit: Payer: Self-pay

## 2018-12-23 DIAGNOSIS — S90412A Abrasion, left great toe, initial encounter: Secondary | ICD-10-CM | POA: Insufficient documentation

## 2018-12-23 DIAGNOSIS — Y998 Other external cause status: Secondary | ICD-10-CM | POA: Diagnosis not present

## 2018-12-23 DIAGNOSIS — I10 Essential (primary) hypertension: Secondary | ICD-10-CM | POA: Insufficient documentation

## 2018-12-23 DIAGNOSIS — Y9301 Activity, walking, marching and hiking: Secondary | ICD-10-CM | POA: Diagnosis not present

## 2018-12-23 DIAGNOSIS — Y92414 Local residential or business street as the place of occurrence of the external cause: Secondary | ICD-10-CM | POA: Insufficient documentation

## 2018-12-23 DIAGNOSIS — S90811A Abrasion, right foot, initial encounter: Secondary | ICD-10-CM | POA: Insufficient documentation

## 2018-12-23 DIAGNOSIS — Z8673 Personal history of transient ischemic attack (TIA), and cerebral infarction without residual deficits: Secondary | ICD-10-CM | POA: Diagnosis not present

## 2018-12-23 DIAGNOSIS — Z79899 Other long term (current) drug therapy: Secondary | ICD-10-CM | POA: Insufficient documentation

## 2018-12-23 DIAGNOSIS — W010XXA Fall on same level from slipping, tripping and stumbling without subsequent striking against object, initial encounter: Secondary | ICD-10-CM | POA: Insufficient documentation

## 2018-12-23 DIAGNOSIS — S90819A Abrasion, unspecified foot, initial encounter: Secondary | ICD-10-CM

## 2018-12-23 DIAGNOSIS — S299XXA Unspecified injury of thorax, initial encounter: Secondary | ICD-10-CM | POA: Diagnosis present

## 2018-12-23 DIAGNOSIS — S20212A Contusion of left front wall of thorax, initial encounter: Secondary | ICD-10-CM | POA: Insufficient documentation

## 2018-12-23 MED ORDER — LIDOCAINE HCL (PF) 1 % IJ SOLN
INTRAMUSCULAR | Status: AC
  Filled 2018-12-23: qty 5

## 2018-12-23 MED ORDER — TRAMADOL HCL 50 MG PO TABS: 50.0000 mg | ORAL_TABLET | Freq: Four times a day (QID) | ORAL | 0 refills | Status: DC | PRN

## 2018-12-23 NOTE — ED Triage Notes (Signed)
Pt. Takes blood thinners 

## 2018-12-23 NOTE — ED Notes (Signed)
Pt's feet were irrigated, bacitracin applied and new bandages placed.

## 2018-12-23 NOTE — ED Triage Notes (Signed)
Pt. Stated, I fell outside having pain under my left brest. I also hve splinters on both feet.

## 2018-12-23 NOTE — ED Notes (Signed)
No answer when called for room 

## 2018-12-23 NOTE — Discharge Instructions (Signed)
Your x-rays show no signs of broken bones, you may use the acetaminophen for pain, tramadol should only be used for severe pain, do not drive and take this medication.  Call your doctor in the morning for follow-up.  Please use an antibiotic ointment on your feet, seek medical exam for severe or worsening symptoms.

## 2018-12-23 NOTE — ED Notes (Signed)
Patient verbalizes understanding of discharge instructions. Opportunity for questioning and answers were provided. Armband removed by staff, pt discharged from ED in wheelchair.  

## 2018-12-23 NOTE — ED Provider Notes (Signed)
[facility] EMERGENCY DEPARTMENT Provider Note   CSN: [id number] Arrival date & time: 12/23/18  1804    History   Chief Complaint Chief Complaint  Patient presents with   Foot Pain   Fall    HPI Chelsey Haas is a 78 y.o. female.     HPI  The patient is a 78 year old female, she has a multiple medical history including stroke high blood pressure and high cholesterol.  She is currently taking an anticoagulant.  She reports that she was walking across the street to her friend's house when she had an accidental fall when she slipped walking in house slippers.  She reports that she suffered slight slivers to the bottom of her feet but also suffered an injury to her left ribs which hurt.  She has broken these ribs in the past when she had another accident.  She denies shortness of breath, she does not have pain with deep breathing, she did not hit her head.  This occurred a short time prior to arrival, symptoms are persistent, worse with ambulation, worse with palpation of the left chest wall.  She did not take any medication prior to arrival however she did soak her feet and tried to pull out the slivers as she saw them.  Past Medical History:  Diagnosis Date   Acid reflux    High cholesterol    Hypertension    Stroke (HCC)    Vertigo     There are no active problems to display for this patient.   Past Surgical History:  Procedure Laterality Date   ABDOMINAL HYSTERECTOMY     CHOLECYSTECTOMY     LEG SURGERY       OB History   No obstetric history on file.      Home Medications    Prior to Admission medications   Medication Sig Start Date End Date Taking? Authorizing Provider  acetaminophen (TYLENOL) 500 MG tablet Take 1,000 mg by mouth every 6 (six) hours as needed.    [provider]  Calcium Carbonate-Vitamin D (CALCIUM + D PO) Take 1 tablet by mouth every evening.    [provider]  clopidogrel (PLAVIX) 75 MG  tablet Take 75 mg by mouth daily.    [provider]  lisinopril-hydrochlorothiazide (PRINZIDE,ZESTORETIC) 20-25 MG per tablet Take 1 tablet by mouth daily.    [provider]  meclizine (ANTIVERT) 25 MG tablet Take 1 tablet (25 mg total) by mouth every 6 (six) hours as needed for dizziness. Patient not taking: Reported on 12/10/2014 02/28/13   Wayland Salinas, MD  Multiple Vitamins-Minerals (MULTIVITAMIN PO) Take 0.5 tablets by mouth daily.    [provider]  pantoprazole (PROTONIX) 40 MG tablet Take 40 mg by mouth daily.    [provider]  simvastatin (ZOCOR) 40 MG tablet Take 40 mg by mouth every evening.    [provider]  traMADol (ULTRAM) 50 MG tablet Take 1 tablet (50 mg total) by mouth every 6 (six) hours as needed. 12/23/18   Eber Hong, MD    Family History No family history on file.  Social History Social History   Tobacco Use   Smoking status: Never Smoker   Smokeless tobacco: Never Used  Substance Use Topics   Alcohol use: No   Drug use: No     Allergies   Patient has no known allergies.   Review of Systems Review of Systems  All other systems reviewed and are negative.    Physical  Exam Updated Vital Signs BP (!) 138/111 (BP Location: Left Arm)    Pulse 85    Temp 98.5 F (36.9 C) (Oral)    Resp 18    Ht 1.6 m (5\' 3" )    Wt 75.8 kg    SpO2 100%    BMI 29.58 kg/m   Physical Exam Vitals signs and nursing note reviewed.  Constitutional:      General: She is not in acute distress.    Appearance: She is well-developed.  HENT:     Head: Normocephalic and atraumatic.     Mouth/Throat:     Pharynx: No oropharyngeal exudate.  Eyes:     General: No scleral icterus.       Right eye: No discharge.        Left eye: No discharge.     Conjunctiva/sclera: Conjunctivae normal.     Pupils: Pupils are equal, round, and reactive to light.  Neck:     Musculoskeletal: Normal range of motion and neck supple.     Thyroid:  No thyromegaly.     Vascular: No JVD.  Cardiovascular:     Rate and Rhythm: Normal rate and regular rhythm.     Heart sounds: Normal heart sounds. No murmur. No friction rub. No gallop.   Pulmonary:     Effort: Pulmonary effort is normal. No respiratory distress.     Breath sounds: Normal breath sounds. No wheezing or rales.     Comments: Mild tenderness under the left breast as the patient retracts the breast upwards.  There is no crepitance or subcutaneous emphysema Chest:     Chest wall: Tenderness present.  Abdominal:     General: Bowel sounds are normal. There is no distension.     Palpations: Abdomen is soft. There is no mass.     Tenderness: There is no abdominal tenderness.  Musculoskeletal: Normal range of motion.        General: No tenderness.  Lymphadenopathy:     Cervical: No cervical adenopathy.  Skin:    General: Skin is warm and dry.     Findings: No erythema or rash.     Comments: Abrasions to the bottom of the right foot as well as the left great toe, small sliver seen  Neurological:     Mental Status: She is alert.     Coordination: Coordination normal.  Psychiatric:        Behavior: Behavior normal.      ED Treatments / Results  Labs (all labs ordered are listed, but only abnormal results are displayed) Labs Reviewed - No data to display  EKG None  Radiology Dg Ribs Unilateral W/chest Left  Result Date: 12/23/2018 CLINICAL DATA:  Fall, left lateral rib pain EXAM: LEFT RIBS AND CHEST - 3+ VIEW COMPARISON:  05/27/2010 FINDINGS: Multiple old left rib fractures are noted. Scarring in the left lung. No definite acute rib fracture. Right lung clear. No effusions or pneumothorax. Heart is normal size. IMPRESSION: Multiple old left rib fractures, stable since prior study. No definite visible acute rib fracture. Areas of scarring in the left lung. No active cardiopulmonary disease. Electronically Signed   By: Charlett Nose M.D.   On: 12/23/2018 19:29     Procedures Procedures (including critical care time)  Medications Ordered in ED Medications  lidocaine (PF) (XYLOCAINE) 1 % injection (has no administration in time range)     Initial Impression / Assessment and Plan / ED Course  I have reviewed the triage vital signs  and the nursing notes.  Pertinent labs & imaging results that were available during my care of the patient were reviewed by me and considered in my medical decision making (see chart for details).       The patient's gait is normal, she has no tenderness over her spine, she has some mild tenderness over the left ribs and there is no signs of acute fracture though she does have old rib fractures which she does endorse.  At this time the patient will have some lidocaine to pull out the last of the slivers of her foot.  Otherwise she is well-appearing with a negative x-ray and can be discharged home.  She is agreeable.  The patient has several small splinters, these were evaluated and removed, this was done under direct visualization with a small forceps.  The patient tolerated this very well.  Topical antibiotics applied to wounds prior to discharge, patient updated on her findings including the x-ray, stable for discharge  Final Clinical Impressions(s) / ED Diagnoses   Final diagnoses:  Contusion of rib on left side, initial encounter  Abrasion of foot, unspecified laterality, initial encounter    ED Discharge Orders         Ordered    traMADol (ULTRAM) 50 MG tablet  Every 6 hours PRN     12/23/18 2224           Eber Hong, MD 12/23/18 2225

## 2020-07-26 ENCOUNTER — Ambulatory Visit: Payer: PRIVATE HEALTH INSURANCE | Attending: Internal Medicine

## 2020-07-26 DIAGNOSIS — Z23 Encounter for immunization: Secondary | ICD-10-CM

## 2020-07-26 NOTE — Progress Notes (Signed)
   Covid-19 Vaccination Clinic  Name:  Chelsey Haas    MRN: [id number] DOB: [birth year]  07/26/2020  Chelsey Haas was observed post Covid-19 immunization for 15 minutes without incident. She was provided with Vaccine Information Sheet and instruction to access the V-Safe system.   Chelsey Haas was instructed to call 911 with any severe reactions post vaccine: Marland Kitchen Difficulty breathing  . Swelling of face and throat  . A fast heartbeat  . A bad rash all over body  . Dizziness and weakness

## 2021-03-16 ENCOUNTER — Ambulatory Visit: Payer: PRIVATE HEALTH INSURANCE | Attending: Internal Medicine

## 2021-03-16 ENCOUNTER — Other Ambulatory Visit (HOSPITAL_BASED_OUTPATIENT_CLINIC_OR_DEPARTMENT_OTHER): Payer: Self-pay

## 2021-03-16 ENCOUNTER — Other Ambulatory Visit: Payer: Self-pay

## 2021-03-16 DIAGNOSIS — Z23 Encounter for immunization: Secondary | ICD-10-CM

## 2021-03-16 MED ORDER — PFIZER-BIONT COVID-19 VAC-TRIS 30 MCG/0.3ML IM SUSP
INTRAMUSCULAR | 0 refills | Status: DC
  Filled 2021-03-16: qty 0.3, 1d supply, fill #0

## 2021-03-16 NOTE — Progress Notes (Signed)
   Covid-19 Vaccination Clinic  Name:  Roselynn Whitacre    MRN: [id number] DOB: 1941-05-29  03/16/2021  Ms. Ferraiolo was observed post Covid-19 immunization for 15 minutes without incident. She was provided with Vaccine Information Sheet and instruction to access the V-Safe system.   Ms. Shareef was instructed to call 911 with any severe reactions post vaccine: Marland Kitchen Difficulty breathing  . Swelling of face and throat  . A fast heartbeat  . A bad rash all over body  . Dizziness and weakness   Immunizations Administered    Name Date Dose VIS Date Route   PFIZER Comrnaty(Gray TOP) Covid-19 Vaccine 03/16/2021  1:16 PM 0.3 mL 09/16/2020 Intramuscular   Manufacturer: [employer], [employer]   Lot: [id number]   NDC: [phone]

## 2021-04-20 ENCOUNTER — Emergency Department (HOSPITAL_BASED_OUTPATIENT_CLINIC_OR_DEPARTMENT_OTHER)
Admission: EM | Admit: 2021-04-20 | Discharge: 2021-04-20 | Disposition: A | Discharge status: Home / Self Care | Payer: PRIVATE HEALTH INSURANCE | Attending: Emergency Medicine | Admitting: Emergency Medicine

## 2021-04-20 ENCOUNTER — Encounter (HOSPITAL_BASED_OUTPATIENT_CLINIC_OR_DEPARTMENT_OTHER): Payer: Self-pay | Admitting: *Deleted

## 2021-04-20 ENCOUNTER — Other Ambulatory Visit: Payer: Self-pay

## 2021-04-20 DIAGNOSIS — I10 Essential (primary) hypertension: Secondary | ICD-10-CM | POA: Diagnosis not present

## 2021-04-20 DIAGNOSIS — Z7902 Long term (current) use of antithrombotics/antiplatelets: Secondary | ICD-10-CM | POA: Diagnosis not present

## 2021-04-20 DIAGNOSIS — R42 Dizziness and giddiness: Secondary | ICD-10-CM

## 2021-04-20 DIAGNOSIS — Z79899 Other long term (current) drug therapy: Secondary | ICD-10-CM | POA: Insufficient documentation

## 2021-04-20 LAB — COMPREHENSIVE METABOLIC PANEL
ALT: 10 U/L (ref 0–44)
AST: 15 U/L (ref 15–41)
Albumin: 4.3 g/dL (ref 3.5–5.0)
Alkaline Phosphatase: 60 U/L (ref 38–126)
Anion gap: 11 (ref 5–15)
BUN: 16 mg/dL (ref 8–23)
CO2: 27 mmol/L (ref 22–32)
Calcium: 10 mg/dL (ref 8.9–10.3)
Chloride: 101 mmol/L (ref 98–111)
Creatinine, Ser: 1.11 mg/dL — ABNORMAL HIGH (ref 0.44–1.00)
GFR, Estimated: 51 mL/min — ABNORMAL LOW (ref >60)
Glucose, Bld: 155 mg/dL — ABNORMAL HIGH (ref 70–99)
Potassium: 3.6 mmol/L (ref 3.5–5.1)
Sodium: 139 mmol/L (ref 135–145)
Total Bilirubin: 0.5 mg/dL (ref 0.3–1.2)
Total Protein: 6.8 g/dL (ref 6.5–8.1)

## 2021-04-20 LAB — CBC WITH DIFFERENTIAL/PLATELET
Abs Immature Granulocytes: 0.02 10*3/uL (ref 0.00–0.07)
Basophils Absolute: 0 10*3/uL (ref 0.0–0.1)
Basophils Relative: 1 %
Eosinophils Absolute: 0 10*3/uL (ref 0.0–0.5)
Eosinophils Relative: 0 %
HCT: 35.9 % — ABNORMAL LOW (ref 36.0–46.0)
Hemoglobin: 11.9 g/dL — ABNORMAL LOW (ref 12.0–15.0)
Immature Granulocytes: 0 %
Lymphocytes Relative: 12 %
Lymphs Abs: 0.7 10*3/uL (ref 0.7–4.0)
MCH: 30.1 pg (ref 26.0–34.0)
MCHC: 33.1 g/dL (ref 30.0–36.0)
MCV: 90.9 fL (ref 80.0–100.0)
Monocytes Absolute: 0.4 10*3/uL (ref 0.1–1.0)
Monocytes Relative: 6 %
Neutro Abs: 4.6 10*3/uL (ref 1.7–7.7)
Neutrophils Relative %: 81 %
Platelets: 241 10*3/uL (ref 150–400)
RBC: 3.95 MIL/uL (ref 3.87–5.11)
RDW: 12 % (ref 11.5–15.5)
WBC: 5.7 10*3/uL (ref 4.0–10.5)
nRBC: 0 % (ref 0.0–0.2)

## 2021-04-20 MED ORDER — DIPHENHYDRAMINE HCL 50 MG/ML IJ SOLN
12.5000 mg | Freq: Once | INTRAMUSCULAR | Status: AC
  Administered 2021-04-20: 12.5 mg via INTRAVENOUS
  Filled 2021-04-20: qty 1

## 2021-04-20 MED ORDER — MECLIZINE HCL 25 MG PO TABS
25.0000 mg | ORAL_TABLET | Freq: Once | ORAL | Status: AC
  Administered 2021-04-20: 25 mg via ORAL
  Filled 2021-04-20: qty 1

## 2021-04-20 MED ORDER — METOCLOPRAMIDE HCL 5 MG/ML IJ SOLN
5.0000 mg | Freq: Once | INTRAMUSCULAR | Status: AC
  Administered 2021-04-20: 5 mg via INTRAVENOUS
  Filled 2021-04-20: qty 2

## 2021-04-20 MED ORDER — SODIUM CHLORIDE 0.9 % IV BOLUS
1000.0000 mL | Freq: Once | INTRAVENOUS | Status: AC
  Administered 2021-04-20: 1000 mL via INTRAVENOUS

## 2021-04-20 MED ORDER — MECLIZINE HCL 25 MG PO TABS: 25.0000 mg | ORAL_TABLET | Freq: Three times a day (TID) | ORAL | 0 refills | Status: DC | PRN

## 2021-04-20 NOTE — ED Provider Notes (Signed)
[facility] EMERGENCY DEPT Provider Note   CSN: [id number] Arrival date & time: 04/20/21  1638     History Chief Complaint  Patient presents with   Dizziness    Chelsey Haas is a 80 y.o. female.  80 yo F with a chief complaint of dizziness.  Patient has a history of the same.  She tells me is been going on for about 3 months and worsening over the past 24 hours.  Every time she tries to sit up or move her head in a certain position she feels like she is spinning resolves when she changes her head position and hold still.  Has had some nausea and vomiting with this.  Has been seen multiple times for this in the past.  Is not sure what exactly the cause is.  Typically resolves on its own.  She denies recent head injury denies neck pain denies headache.  Feels like she has been eating and drinking normally.  The history is provided by the patient.  Dizziness Quality:  Head spinning Severity:  Severe Onset quality:  Gradual Duration:  2 days Timing:  Constant Progression:  Worsening Chronicity:  New Relieved by:  Nothing Worsened by:  Nothing Ineffective treatments:  None tried Associated symptoms: no chest pain, no headaches, no nausea, no palpitations, no shortness of breath and no vomiting       Past Medical History:  Diagnosis Date   Acid reflux    High cholesterol    Hypertension    Stroke (HCC)    Vertigo     There are no problems to display for this patient.   Past Surgical History:  Procedure Laterality Date   ABDOMINAL HYSTERECTOMY     CHOLECYSTECTOMY     LEG SURGERY       OB History     Gravida  2   Para  2   Term      Preterm      AB      Living         SAB      IAB      Ectopic      Multiple      Live Births              History reviewed. No pertinent family history.  Social History   Tobacco Use   Smoking status: Never   Smokeless tobacco: Never  Vaping Use   Vaping Use: Never used  Substance  Use Topics   Alcohol use: No   Drug use: No    Home Medications Prior to Admission medications   Medication Sig Start Date End Date Taking? Authorizing Provider  clopidogrel (PLAVIX) 75 MG tablet Take 75 mg by mouth daily.   Yes [provider]  lisinopril-hydrochlorothiazide (PRINZIDE,ZESTORETIC) 20-25 MG per tablet Take 1 tablet by mouth daily.   Yes [provider]  meclizine (ANTIVERT) 25 MG tablet Take 1 tablet (25 mg total) by mouth 3 (three) times daily as needed for dizziness. 04/20/21  Yes Melene Plan, DO  Multiple Vitamins-Minerals (MULTIVITAMIN PO) Take 0.5 tablets by mouth daily.   Yes [provider]  pantoprazole (PROTONIX) 40 MG tablet Take 40 mg by mouth daily.   Yes [provider]  simvastatin (ZOCOR) 40 MG tablet Take 40 mg by mouth every evening.   Yes [provider]  acetaminophen (TYLENOL) 500 MG tablet Take 1,000 mg by mouth every 6 (six) hours as needed.    [provider]  COVID-19 mRNA Vac-TriS, Pfizer, (PFIZER-BIONT COVID-19 VAC-TRIS) SUSP injection Inject into the muscle. 03/16/21   Judyann Munson, MD    Allergies    Patient has no known allergies.  Review of Systems   Review of Systems  Constitutional:  Negative for chills and fever.  HENT:  Negative for congestion and rhinorrhea.   Eyes:  Negative for redness and visual disturbance.  Respiratory:  Negative for shortness of breath and wheezing.   Cardiovascular:  Negative for chest pain and palpitations.  Gastrointestinal:  Negative for nausea and vomiting.  Genitourinary:  Negative for dysuria and urgency.  Musculoskeletal:  Negative for arthralgias and myalgias.  Skin:  Negative for pallor and wound.  Neurological:  Positive for dizziness. Negative for headaches.   Physical Exam Updated Vital Signs BP 125/74 (BP Location: Right Arm)   Pulse 68   Temp 98.3 F (36.8 C) (Oral)   Resp 18   Ht 5\' 3"  (1.6 m)   Wt 76.7 kg   SpO2 98%   BMI 29.94  kg/m   Physical Exam Vitals and nursing note reviewed.  Constitutional:      General: She is not in acute distress.    Appearance: She is well-developed. She is not diaphoretic.  HENT:     Head: Normocephalic and atraumatic.  Eyes:     Pupils: Pupils are equal, round, and reactive to light.  Cardiovascular:     Rate and Rhythm: Normal rate and regular rhythm.     Heart sounds: No murmur heard.   No friction rub. No gallop.  Pulmonary:     Effort: Pulmonary effort is normal.     Breath sounds: No wheezing or rales.  Abdominal:     General: There is no distension.     Palpations: Abdomen is soft.     Tenderness: There is no abdominal tenderness.  Musculoskeletal:        General: No tenderness.     Cervical back: Normal range of motion and neck supple.  Skin:    General: Skin is warm and dry.  Neurological:     Mental Status: She is alert and oriented to person, place, and time.     Cranial Nerves: Cranial nerves are intact.     Sensory: Sensation is intact.     Motor: Motor function is intact.     Coordination: Coordination is intact.     Comments: Left-sided fast-growing nystagmus, mild right leg weakness which the patient states is chronic for him having a plate placed in her leg.  Otherwise benign neurologic exam.  Psychiatric:        Behavior: Behavior normal.    ED Results / Procedures / Treatments   Labs (all labs ordered are listed, but only abnormal results are displayed) Labs Reviewed  CBC WITH DIFFERENTIAL/PLATELET - Abnormal; Notable for the following components:      Result Value   Hemoglobin 11.9 (*)    HCT 35.9 (*)    All other components within normal limits  COMPREHENSIVE METABOLIC PANEL - Abnormal; Notable for the following components:   Glucose, Bld 155 (*)    Creatinine, Ser 1.11 (*)    GFR, Estimated 51 (*)    All other components within normal limits    EKG EKG Interpretation  Date/Time:  Wednesday April 20 2021 17:00:01 EDT Ventricular  Rate:  71 PR Interval:  171 QRS Duration: 109 QT Interval:  427 QTC Calculation: 464 R Axis:   28 Text Interpretation: Sinus rhythm Nonspecific repol abnormality, lateral  leads ST elevation, consider inferior injury No significant change since last tracing Confirmed by Melene Plan [phone]) on 04/20/2021 5:54:28 PM  Radiology No results found.  Procedures Procedures   Medications Ordered in ED Medications  sodium chloride 0.9 % bolus 1,000 mL (0 mLs Intravenous Stopped 04/20/21 1953)  metoCLOPramide (REGLAN) injection 5 mg (5 mg Intravenous Given 04/20/21 1805)  diphenhydrAMINE (BENADRYL) injection 12.5 mg (12.5 mg Intravenous Given 04/20/21 1814)  meclizine (ANTIVERT) tablet 25 mg (25 mg Oral Given 04/20/21 1808)    ED Course  I have reviewed the triage vital signs and the nursing notes.  Pertinent labs & imaging results that were available during my care of the patient were reviewed by me and considered in my medical decision making (see chart for details).    MDM Rules/Calculators/A&P                          80 yo F with a chief complaint of dizziness.  This seems episodic and positional.  Patient tells me has been going on for 3 months but on record review the patient has had this at least for a year if not longer.  Seems less likely to be a stroke with the chronicity of it.  We will treat her acute symptoms check blood work and reassess.  No significant electrolyte abnormality no significant anemia.  Patient was able to ambulate independently without issue.  Will discharge home.  Given ENT follow-up if needed.  11:27 PM:  I have discussed the diagnosis/risks/treatment options with the patient and family and believe the pt to be eligible for discharge home to follow-up with PCP, ENT. We also discussed returning to the ED immediately if new or worsening sx occur. We discussed the sx which are most concerning (e.g., sudden worsening pain, fever, inability to tolerate by mouth stroke,  s/ex) that necessitate immediate return. Medications administered to the patient during their visit and any new prescriptions provided to the patient are listed below.  Medications given during this visit Medications  sodium chloride 0.9 % bolus 1,000 mL (0 mLs Intravenous Stopped 04/20/21 1953)  metoCLOPramide (REGLAN) injection 5 mg (5 mg Intravenous Given 04/20/21 1805)  diphenhydrAMINE (BENADRYL) injection 12.5 mg (12.5 mg Intravenous Given 04/20/21 1814)  meclizine (ANTIVERT) tablet 25 mg (25 mg Oral Given 04/20/21 1808)     The patient appears reasonably screen and/or stabilized for discharge and I doubt any other medical condition or other [facility] requiring further screening, evaluation, or treatment in the ED at this time prior to discharge.   Final Clinical Impression(s) / ED Diagnoses Final diagnoses:  Vertigo    Rx / DC Orders ED Discharge Orders          Ordered    meclizine (ANTIVERT) 25 MG tablet  3 times daily PRN        04/20/21 2007             Melene Plan, DO 04/20/21 2327

## 2021-04-20 NOTE — ED Notes (Signed)
Patient ambulated in hallway with assistance. HR 89  Oxygen 98. Patient states she is not as dizzy

## 2021-04-20 NOTE — ED Triage Notes (Signed)
Vertigo for 3-4 weeks getting worst today.

## 2021-04-20 NOTE — Discharge Instructions (Addendum)
Follow up with your family doctor.  Please return for worsening symptoms one-sided numbness or weakness difficulty with speech or swallowing or if you are unable to walk.

## 2022-03-09 ENCOUNTER — Emergency Department (HOSPITAL_BASED_OUTPATIENT_CLINIC_OR_DEPARTMENT_OTHER): Payer: 59 | Admitting: Radiology

## 2022-03-09 ENCOUNTER — Emergency Department (HOSPITAL_BASED_OUTPATIENT_CLINIC_OR_DEPARTMENT_OTHER): Payer: 59

## 2022-03-09 ENCOUNTER — Encounter (HOSPITAL_BASED_OUTPATIENT_CLINIC_OR_DEPARTMENT_OTHER): Payer: Self-pay

## 2022-03-09 ENCOUNTER — Emergency Department (HOSPITAL_BASED_OUTPATIENT_CLINIC_OR_DEPARTMENT_OTHER)
Admission: EM | Admit: 2022-03-09 | Discharge: 2022-03-09 | Disposition: A | Discharge status: Home / Self Care | Payer: 59 | Attending: Emergency Medicine | Admitting: Emergency Medicine

## 2022-03-09 ENCOUNTER — Other Ambulatory Visit: Payer: Self-pay

## 2022-03-09 DIAGNOSIS — S8002XA Contusion of left knee, initial encounter: Secondary | ICD-10-CM | POA: Diagnosis not present

## 2022-03-09 DIAGNOSIS — Y92480 Sidewalk as the place of occurrence of the external cause: Secondary | ICD-10-CM | POA: Insufficient documentation

## 2022-03-09 DIAGNOSIS — S0990XA Unspecified injury of head, initial encounter: Secondary | ICD-10-CM | POA: Diagnosis not present

## 2022-03-09 DIAGNOSIS — Z7901 Long term (current) use of anticoagulants: Secondary | ICD-10-CM | POA: Diagnosis not present

## 2022-03-09 DIAGNOSIS — W01198A Fall on same level from slipping, tripping and stumbling with subsequent striking against other object, initial encounter: Secondary | ICD-10-CM | POA: Insufficient documentation

## 2022-03-09 DIAGNOSIS — S8001XA Contusion of right knee, initial encounter: Secondary | ICD-10-CM | POA: Insufficient documentation

## 2022-03-09 DIAGNOSIS — Y9301 Activity, walking, marching and hiking: Secondary | ICD-10-CM | POA: Insufficient documentation

## 2022-03-09 DIAGNOSIS — S8991XA Unspecified injury of right lower leg, initial encounter: Secondary | ICD-10-CM | POA: Diagnosis present

## 2022-03-09 DIAGNOSIS — W19XXXA Unspecified fall, initial encounter: Secondary | ICD-10-CM

## 2022-03-09 NOTE — ED Notes (Signed)
Patient transported to CT 

## 2022-03-09 NOTE — ED Provider Notes (Signed)
[facility] EMERGENCY DEPT Provider Note   CSN: [id number] Arrival date & time: 03/09/22  1954     History  Chief Complaint  Patient presents with   Fall    Chelsey Haas is a 81 y.o. female presenting to the ED with a chief complaint of right knee pain after mechanical fall that occurred approximately 2 hours ago.  States that she was walking on the sidewalk when she did not notice a small tube on the ground which caused her to fall.  She believes she landed on her right knee and hit the right side of her head.  Denies any loss of consciousness.  She does not typically ambulate with assistance.  She has had strokes in the past and has affected her right side.  She denies any headache as she took 2 Tylenol PMs after this happened.  No blurry vision, numbness in arms or legs, neck pain, back pain, vomiting.  She takes Plavix daily   Fall Associated symptoms include headaches. Pertinent negatives include no chest pain, no abdominal pain and no shortness of breath.      Home Medications Prior to Admission medications   Medication Sig Start Date End Date Taking? Authorizing Provider  acetaminophen (TYLENOL) 500 MG tablet Take 1,000 mg by mouth every 6 (six) hours as needed.    [provider]  clopidogrel (PLAVIX) 75 MG tablet Take 75 mg by mouth daily.    [provider]  COVID-19 mRNA Vac-TriS, Pfizer, (PFIZER-BIONT COVID-19 VAC-TRIS) SUSP injection Inject into the muscle. 03/16/21   Carlyle Basques, MD  lisinopril-hydrochlorothiazide (PRINZIDE,ZESTORETIC) 20-25 MG per tablet Take 1 tablet by mouth daily.    [provider]  meclizine (ANTIVERT) 25 MG tablet Take 1 tablet (25 mg total) by mouth 3 (three) times daily as needed for dizziness. 04/20/21   Deno Etienne, DO  Multiple Vitamins-Minerals (MULTIVITAMIN PO) Take 0.5 tablets by mouth daily.    [provider]  pantoprazole (PROTONIX) 40 MG tablet Take 40 mg by mouth daily.     [provider]  simvastatin (ZOCOR) 40 MG tablet Take 40 mg by mouth every evening.    [provider]      Allergies    Patient has no known allergies.    Review of Systems   Review of Systems  Constitutional:  Negative for appetite change, chills and fever.  HENT:  Negative for ear pain, rhinorrhea, sneezing and sore throat.   Eyes:  Negative for photophobia and visual disturbance.  Respiratory:  Negative for cough, chest tightness, shortness of breath and wheezing.   Cardiovascular:  Negative for chest pain and palpitations.  Gastrointestinal:  Negative for abdominal pain, blood in stool, constipation, diarrhea, nausea and vomiting.  Genitourinary:  Negative for dysuria, hematuria and urgency.  Musculoskeletal:  Positive for arthralgias. Negative for myalgias.  Skin:  Negative for rash.  Neurological:  Positive for headaches. Negative for dizziness, weakness and light-headedness.   Physical Exam Updated Vital Signs BP (!) 127/58   Pulse 86   Temp 97.8 F (36.6 C) (Oral)   Resp 18   SpO2 97%  Physical Exam Vitals and nursing note reviewed.  Constitutional:      General: She is not in acute distress.    Appearance: She is well-developed.  HENT:     Head: Normocephalic and atraumatic.     Nose: Nose normal.  Eyes:     General: No scleral icterus.       Right eye: No discharge.  Left eye: No discharge.     Conjunctiva/sclera: Conjunctivae normal.     Pupils: Pupils are equal, round, and reactive to light.  Cardiovascular:     Rate and Rhythm: Normal rate and regular rhythm.     Heart sounds: Normal heart sounds. No murmur heard.   No friction rub. No gallop.  Pulmonary:     Effort: Pulmonary effort is normal. No respiratory distress.     Breath sounds: Normal breath sounds.  Abdominal:     General: Bowel sounds are normal. There is no distension.     Palpations: Abdomen is soft.     Tenderness: There is no abdominal tenderness. There is no  guarding.  Musculoskeletal:        General: Normal range of motion.     Cervical back: Normal range of motion and neck supple.     Comments: TTP of bilateral knees without overlying skin changes.  No changes to range of motion.  No erythema, edema or warmth of joint.  2+ DP pulse palpated bilaterally.  No C, T or L-spine tenderness at the midline. No stepoff palpated.  Skin:    General: Skin is warm and dry.     Findings: No rash.  Neurological:     Mental Status: She is alert and oriented to person, place, and time.     Motor: No abnormal muscle tone.     Coordination: Coordination normal.     Comments: Alert, oriented to self, place, situation.  Able to name her visitor who is her neighbor.  No facial asymmetry.  Normal sensation to light touch of bilateral upper and lower extremities, face.  Normal speech.    ED Results / Procedures / Treatments   Labs (all labs ordered are listed, but only abnormal results are displayed) Labs Reviewed - No data to display  EKG None  Radiology CT Head Wo Contrast  Result Date: 03/09/2022 CLINICAL DATA:  Head trauma, minor (Age >= 65y) EXAM: CT HEAD WITHOUT CONTRAST TECHNIQUE: Contiguous axial images were obtained from the base of the skull through the vertex without intravenous contrast. RADIATION DOSE REDUCTION: This exam was performed according to the departmental dose-optimization program which includes automated exposure control, adjustment of the mA and/or kV according to patient size and/or use of iterative reconstruction technique. COMPARISON:  CT head December 10, 2014. FINDINGS: Brain: No evidence of acute large vascular territory infarction, hemorrhage, hydrocephalus, extra-axial collection or mass lesion/mass effect. Remote left MCA territory infarct. Small remote cerebellar lacunar infarcts. Vascular: No hyperdense vessel identified. Calcific intracranial atherosclerosis. Skull: No acute fracture. Sinuses/Orbits: Clear sinuses.  No acute orbital  findings. Other: No mastoid effusions. IMPRESSION: 1. No evidence of acute intracranial abnormality. 2. Remote left MCA territory and cerebellar infarcts. Electronically Signed   By: Margaretha Sheffield M.D.   On: 03/09/2022 20:47   DG Knee Complete 4 Views Left  Result Date: 03/09/2022 CLINICAL DATA:  Status post fall. EXAM: LEFT KNEE - COMPLETE 4+ VIEW COMPARISON:  None Available. FINDINGS: No evidence of an acute fracture, dislocation, or joint effusion. A radiopaque intramedullary rod is seen within the left tibia. An associated chronic fracture deformity is seen within the proximal left tibial shaft. No evidence of arthropathy or other focal bone abnormality. Soft tissues are unremarkable. IMPRESSION: 1. No acute osseous abnormality. 2. Prior ORIF of the proximal left tibial shaft. Electronically Signed   By: Virgina Norfolk M.D.   On: 03/09/2022 21:03   DG Knee Complete 4 Views Right  Result Date:  03/09/2022 CLINICAL DATA:  Status post fall. EXAM: RIGHT KNEE - COMPLETE 4+ VIEW COMPARISON:  None Available. FINDINGS: No evidence of an acute fracture or dislocation. There is marked severity patellofemoral and medial tibiofemoral compartment space narrowing. A small joint effusion is suspected. IMPRESSION: 1. No acute osseous abnormality. 2. 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 Electronically Signed   By: Virgina Norfolk M.D.   On: 03/09/2022 21:01    Procedures Procedures    Medications Ordered in ED Medications - No data to display  ED Course/ Medical Decision Making/ A&P                           Medical Decision Making Amount and/or Complexity of Data Reviewed Radiology: ordered.   81 year old female presenting to the ED after mechanical fall that occurred prior to arrival.  She was walking unassisted which is typically what she does when she fell to noticed an object on the ground, fell onto her right knee and hit the right side of her head.  Denies any  loss of consciousness.  She takes Plavix daily.  Has been ambulatory since then.  States that she did have pain at the site of her head injury but improved as she took 2 Tylenol PM.  On exam patient moving all extremities without difficulty.  She is hemodynamically stable.  She has no facial asymmetry.  She has normal speech.  She is alert, oriented to self, place but believes it is the year 84.  She states that "I only said 64 because my mom was born then and I was making a scrapbook for my sister and put everyones birthday on it."  She has normal range of motion of bilateral knees without overlying skin changes.  Equal and intact distal pulses of bilateral lower extremities.  CT of the head shows no acute intracranial abnormality, does show prior strokes.  X-ray of the right knee with degenerative changes but no acute abnormality.  X-ray of the left knee with changes from prior surgeries but no acute findings  Care handed off to oncoming provider pending reassessment and disposition.   Portions of this note were generated with [employer]. Dictation errors may occur despite best attempts at proofreading.        Final Clinical Impression(s) / ED Diagnoses Final diagnoses:  Fall, initial encounter  Contusion of left knee, initial encounter  Contusion of right knee, initial encounter  Injury of head, initial encounter    Rx / DC Orders ED Discharge Orders     None         Delia Heady, PA-C 03/09/22 2158    Gareth Morgan, MD 03/10/22 1639

## 2022-03-09 NOTE — ED Triage Notes (Signed)
Pt states that she had a mechanical fall, hit head, no LOC, on plavix, c/o of L knee pain as well.

## 2022-03-09 NOTE — Discharge Instructions (Addendum)
It was a pleasure caring for you! Follow instructions regarding head injury precautions. Continue your home medications as previously prescribed. You can apply ice and elevate your extremities to help with knee pain.  We also recommend tylenol and lidocaine patch. Return to the ER if you start to experience worsening symptoms, severe headache, blurry vision, trouble walking, additional injuries

## 2022-03-09 NOTE — ED Provider Notes (Incomplete)
  Physical Exam  BP 130/63   Pulse 85   Temp 97.8 F (36.6 C) (Oral)   Resp 18   SpO2 97%   Physical Exam  Procedures  Procedures  ED Course / MDM    Medical Decision Making Amount and/or Complexity of Data Reviewed Radiology: ordered.   ***

## 2022-03-09 NOTE — ED Notes (Signed)
Pt back from scans

## 2023-03-27 IMAGING — DX DG KNEE COMPLETE 4+V*R*
4 series · 4 of 4 positions shown · non-contrast
Comparison: None Available.

CLINICAL DATA: Status post fall.

EXAM:
RIGHT KNEE - COMPLETE 4+ VIEW

[knee lat]
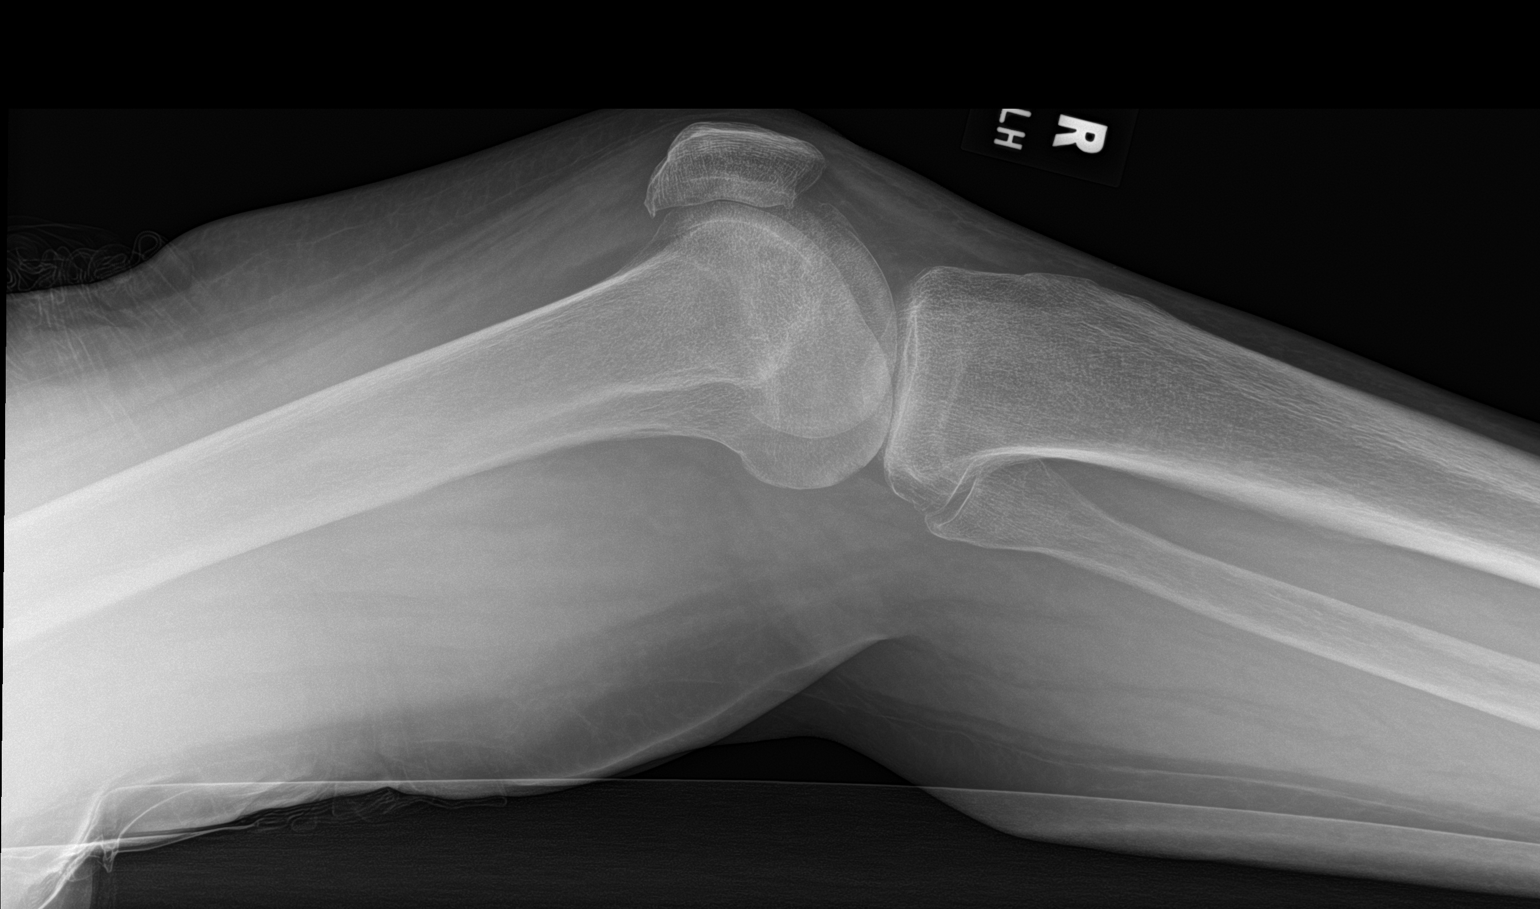

[knee ap]
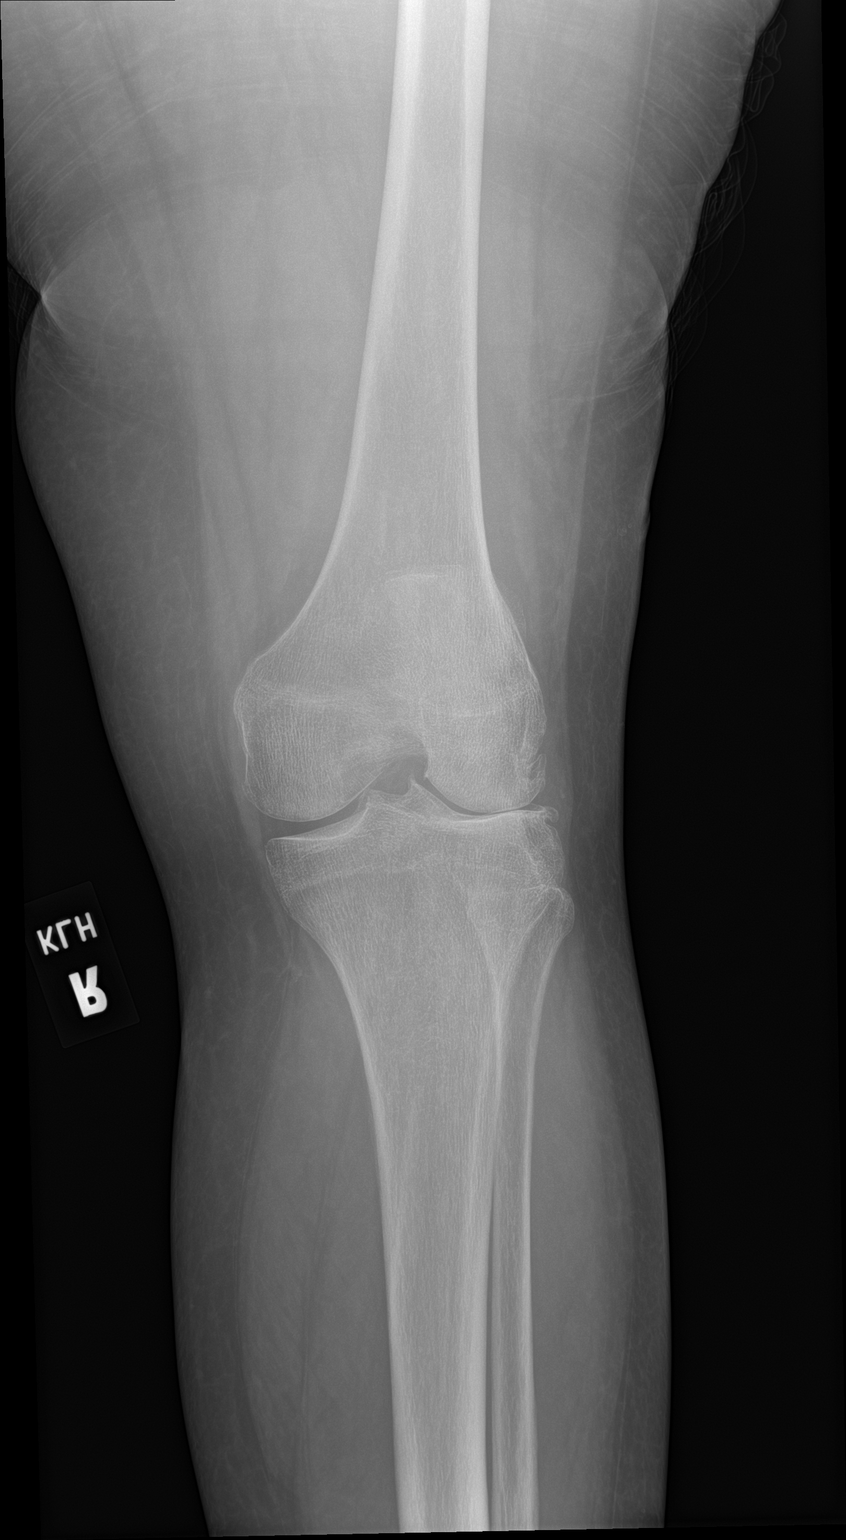

[knee obl (1 of 2)]
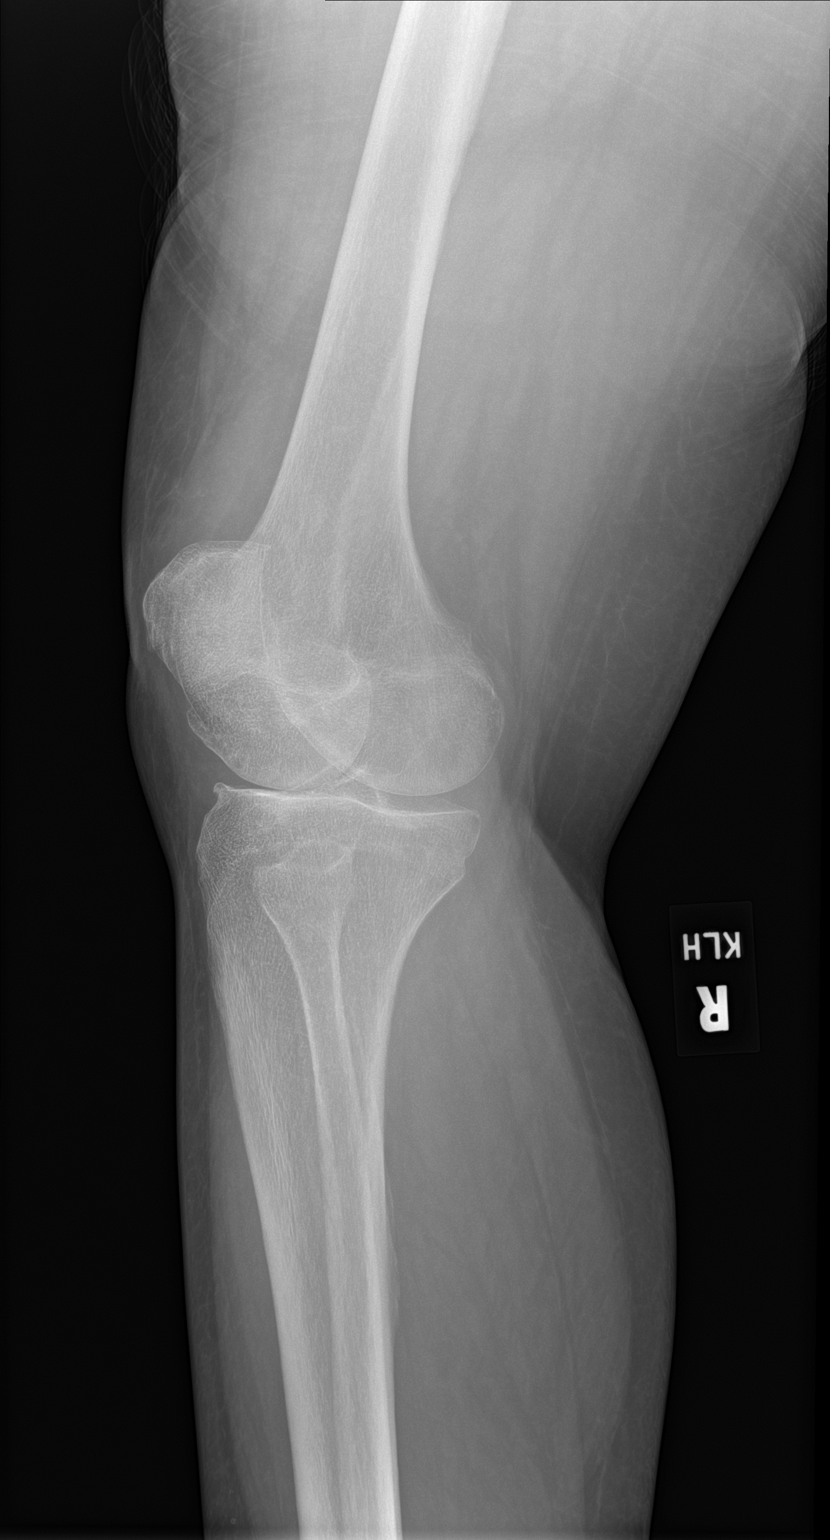

[knee obl (2 of 2)]
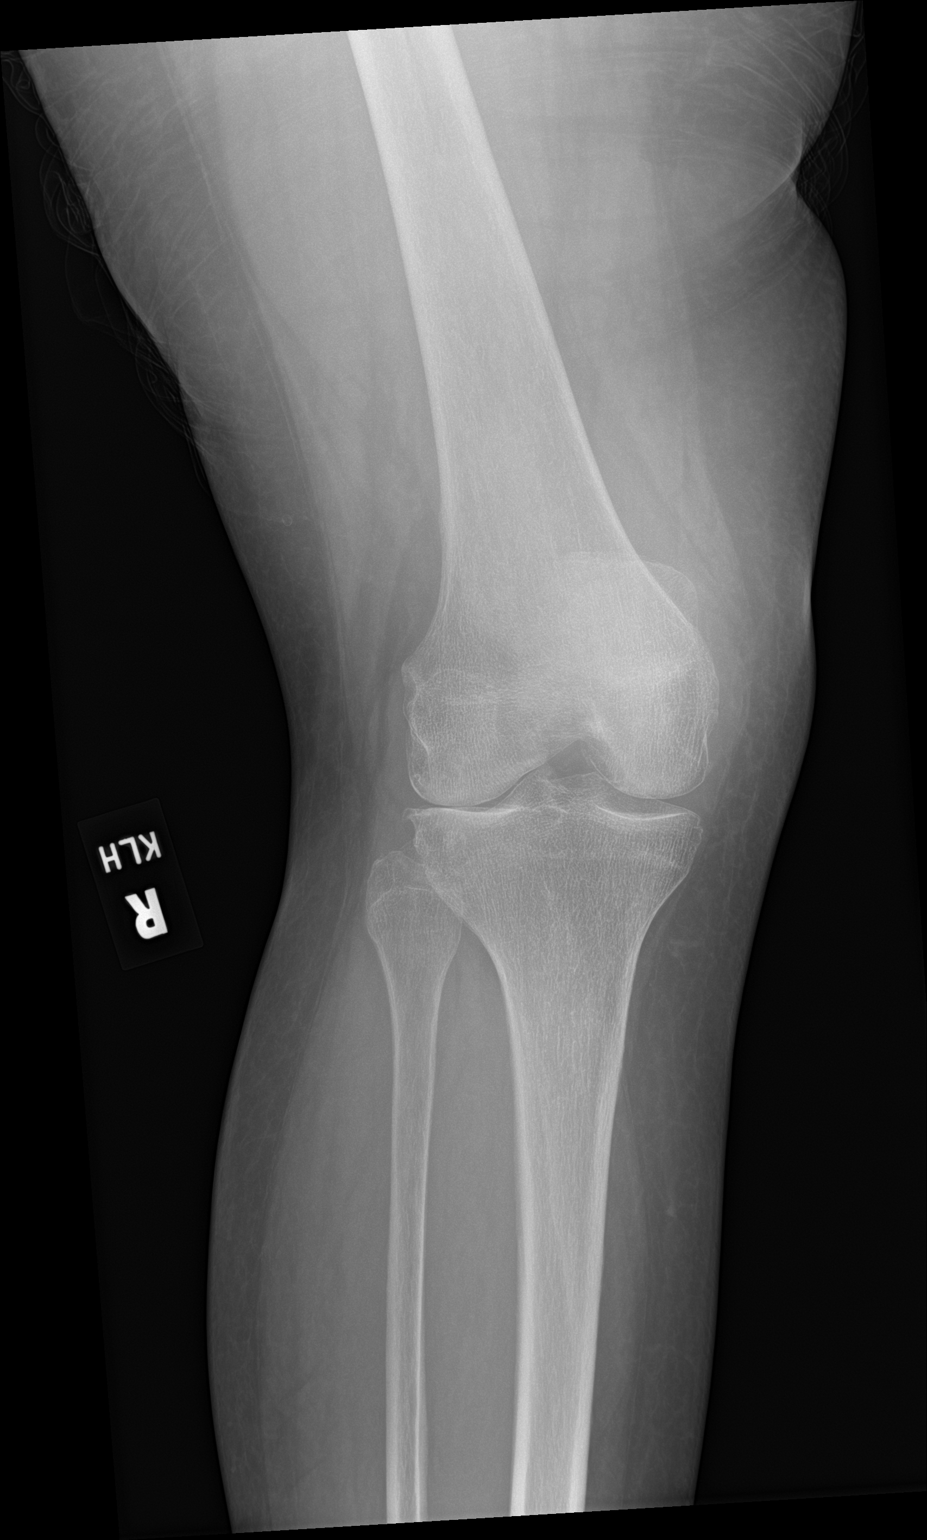

[4 of 4 positions shown; findings below may reference images not displayed]

FINDINGS: No evidence of an acute fracture or dislocation. There is marked
severity patellofemoral and medial tibiofemoral compartment space
narrowing. A small joint effusion is suspected.
IMPRESSION: 1. No acute osseous abnormality.
2. Marked severity degenerative changes with a small joint effusion.

## 2023-04-12 ENCOUNTER — Observation Stay (HOSPITAL_COMMUNITY)
Admission: EM | Admit: 2023-04-12 | Discharge: 2023-04-13 | Disposition: A | Discharge status: Home Health | Payer: 59 | Attending: Student | Admitting: Student

## 2023-04-12 ENCOUNTER — Emergency Department (HOSPITAL_COMMUNITY): Payer: 59

## 2023-04-12 ENCOUNTER — Encounter (HOSPITAL_COMMUNITY): Payer: Self-pay

## 2023-04-12 DIAGNOSIS — K219 Gastro-esophageal reflux disease without esophagitis: Secondary | ICD-10-CM | POA: Diagnosis not present

## 2023-04-12 DIAGNOSIS — Y9301 Activity, walking, marching and hiking: Secondary | ICD-10-CM | POA: Diagnosis not present

## 2023-04-12 DIAGNOSIS — Y92000 Kitchen of unspecified non-institutional (private) residence as  the place of occurrence of the external cause: Secondary | ICD-10-CM | POA: Insufficient documentation

## 2023-04-12 DIAGNOSIS — R531 Weakness: Secondary | ICD-10-CM | POA: Diagnosis present

## 2023-04-12 DIAGNOSIS — E7800 Pure hypercholesterolemia, unspecified: Secondary | ICD-10-CM | POA: Diagnosis present

## 2023-04-12 DIAGNOSIS — Z8673 Personal history of transient ischemic attack (TIA), and cerebral infarction without residual deficits: Secondary | ICD-10-CM

## 2023-04-12 DIAGNOSIS — Z7902 Long term (current) use of antithrombotics/antiplatelets: Secondary | ICD-10-CM | POA: Diagnosis not present

## 2023-04-12 DIAGNOSIS — W19XXXA Unspecified fall, initial encounter: Secondary | ICD-10-CM

## 2023-04-12 DIAGNOSIS — S065XAA Traumatic subdural hemorrhage with loss of consciousness status unknown, initial encounter: Secondary | ICD-10-CM | POA: Diagnosis not present

## 2023-04-12 DIAGNOSIS — Z79899 Other long term (current) drug therapy: Secondary | ICD-10-CM | POA: Insufficient documentation

## 2023-04-12 DIAGNOSIS — S065X0A Traumatic subdural hemorrhage without loss of consciousness, initial encounter: Secondary | ICD-10-CM | POA: Diagnosis not present

## 2023-04-12 DIAGNOSIS — I10 Essential (primary) hypertension: Secondary | ICD-10-CM | POA: Diagnosis present

## 2023-04-12 DIAGNOSIS — W133XXA Fall through floor, initial encounter: Secondary | ICD-10-CM | POA: Diagnosis not present

## 2023-04-12 LAB — BASIC METABOLIC PANEL
Anion gap: 14 (ref 5–15)
BUN: 12 mg/dL (ref 8–23)
CO2: 24 mmol/L (ref 22–32)
Calcium: 9.6 mg/dL (ref 8.9–10.3)
Chloride: 99 mmol/L (ref 98–111)
Creatinine, Ser: 1.13 mg/dL — ABNORMAL HIGH (ref 0.44–1.00)
GFR, Estimated: 49 mL/min — ABNORMAL LOW (ref >60)
Glucose, Bld: 103 mg/dL — ABNORMAL HIGH (ref 70–99)
Potassium: 4.3 mmol/L (ref 3.5–5.1)
Sodium: 137 mmol/L (ref 135–145)

## 2023-04-12 LAB — CBC WITH DIFFERENTIAL/PLATELET
Abs Immature Granulocytes: 0.01 10*3/uL (ref 0.00–0.07)
Basophils Absolute: 0.1 10*3/uL (ref 0.0–0.1)
Basophils Relative: 1 %
Eosinophils Absolute: 0 10*3/uL (ref 0.0–0.5)
Eosinophils Relative: 1 %
HCT: 34.6 % — ABNORMAL LOW (ref 36.0–46.0)
Hemoglobin: 11.5 g/dL — ABNORMAL LOW (ref 12.0–15.0)
Immature Granulocytes: 0 %
Lymphocytes Relative: 19 %
Lymphs Abs: 0.8 10*3/uL (ref 0.7–4.0)
MCH: 30.8 pg (ref 26.0–34.0)
MCHC: 33.2 g/dL (ref 30.0–36.0)
MCV: 92.8 fL (ref 80.0–100.0)
Monocytes Absolute: 0.4 10*3/uL (ref 0.1–1.0)
Monocytes Relative: 10 %
Neutro Abs: 2.9 10*3/uL (ref 1.7–7.7)
Neutrophils Relative %: 69 %
Platelets: 255 10*3/uL (ref 150–400)
RBC: 3.73 MIL/uL — ABNORMAL LOW (ref 3.87–5.11)
RDW: 11.9 % (ref 11.5–15.5)
WBC: 4.2 10*3/uL (ref 4.0–10.5)
nRBC: 0 % (ref 0.0–0.2)

## 2023-04-12 LAB — TROPONIN I (HIGH SENSITIVITY)
Troponin I (High Sensitivity): 3 ng/L (ref <18)
Troponin I (High Sensitivity): 4 ng/L (ref <18)

## 2023-04-12 MED ORDER — POLYETHYLENE GLYCOL 3350 17 G PO PACK: 17.0000 g | PACK | Freq: Every day | ORAL | Status: DC | PRN

## 2023-04-12 MED ORDER — ACETAMINOPHEN 650 MG RE SUPP: 650.0000 mg | Freq: Four times a day (QID) | RECTAL | Status: DC | PRN

## 2023-04-12 MED ORDER — SODIUM CHLORIDE 0.9 % IV BOLUS
1000.0000 mL | Freq: Once | INTRAVENOUS | Status: AC
  Administered 2023-04-12: 1000 mL via INTRAVENOUS

## 2023-04-12 MED ORDER — LISINOPRIL 20 MG PO TABS
20.0000 mg | ORAL_TABLET | Freq: Every day | ORAL | Status: DC
  Administered 2023-04-12: 20 mg via ORAL
  Filled 2023-04-12: qty 1

## 2023-04-12 MED ORDER — ACETAMINOPHEN 325 MG PO TABS
650.0000 mg | ORAL_TABLET | Freq: Once | ORAL | Status: AC
  Administered 2023-04-12: 650 mg via ORAL
  Filled 2023-04-12: qty 2

## 2023-04-12 MED ORDER — PANTOPRAZOLE SODIUM 40 MG PO TBEC
40.0000 mg | DELAYED_RELEASE_TABLET | Freq: Every day | ORAL | Status: DC
  Administered 2023-04-12: 40 mg via ORAL
  Filled 2023-04-12: qty 1

## 2023-04-12 MED ORDER — SERTRALINE HCL 50 MG PO TABS
25.0000 mg | ORAL_TABLET | Freq: Every day | ORAL | Status: DC
  Administered 2023-04-13: 25 mg via ORAL
  Filled 2023-04-12: qty 1

## 2023-04-12 MED ORDER — LISINOPRIL-HYDROCHLOROTHIAZIDE 20-25 MG PO TABS: 1.0000 | ORAL_TABLET | Freq: Every day | ORAL | Status: DC

## 2023-04-12 MED ORDER — SODIUM CHLORIDE 0.9% FLUSH
3.0000 mL | Freq: Two times a day (BID) | INTRAVENOUS | Status: DC
  Administered 2023-04-12: 3 mL via INTRAVENOUS

## 2023-04-12 MED ORDER — HYDROCHLOROTHIAZIDE 25 MG PO TABS
25.0000 mg | ORAL_TABLET | Freq: Every day | ORAL | Status: DC
  Administered 2023-04-12: 25 mg via ORAL
  Filled 2023-04-12: qty 1

## 2023-04-12 MED ORDER — SIMVASTATIN 20 MG PO TABS
40.0000 mg | ORAL_TABLET | Freq: Every day | ORAL | Status: DC
  Administered 2023-04-12: 40 mg via ORAL
  Filled 2023-04-12: qty 2

## 2023-04-12 MED ORDER — ACETAMINOPHEN 325 MG PO TABS
650.0000 mg | ORAL_TABLET | Freq: Four times a day (QID) | ORAL | Status: DC | PRN
  Administered 2023-04-12: 650 mg via ORAL
  Filled 2023-04-12: qty 2

## 2023-04-12 NOTE — Progress Notes (Signed)
Orthopedic Tech Progress Note Patient Details:  Chelsey Haas [birth year] [id number] Level 2 Trauma Patient ID: [name], female   DOB: [birth year], 82 y.o.   MRN: [id number]  Lovett Calender 04/12/2023, 3:29 PM

## 2023-04-12 NOTE — ED Provider Notes (Signed)
[place] EMERGENCY DEPARTMENT AT [facility] Provider Note   CSN: [id number] Arrival date & time: 04/12/23  1431     History  Chief Complaint  Patient presents with   Fall    Chelsey Haas is a 82 y.o. female with past medical history significant for previous stroke who is currently taking Plavix, hypertension, hyperlipidemia presents concern for fall after walking in her kitchen.  She endorses getting dizzy and falling down hardwood floors.  Patient denies any feeling of heart racing, chest pain, shortness of breath.  She reports that she has been getting dizzy more frequently recently.  She does not think that she is dehydrated, reports that she has been drinking plenty of fluids.  She denies any fever, chills, recent diarrhea, frequent urination.  Denies any alcohol, drug use.   Fall       Home Medications Prior to Admission medications   Medication Sig Start Date End Date Taking? Authorizing Provider  acetaminophen (TYLENOL) 500 MG tablet Take 1,000 mg by mouth every 6 (six) hours as needed.   Yes [provider]  clopidogrel (PLAVIX) 75 MG tablet Take 75 mg by mouth at bedtime.   Yes [provider]  diclofenac Sodium (VOLTAREN ARTHRITIS PAIN) 1 % GEL Apply 2 g topically 4 (four) times daily as needed (joint pain).   Yes [provider]  lisinopril-hydrochlorothiazide (PRINZIDE,ZESTORETIC) 20-25 MG per tablet Take 1 tablet by mouth daily.   Yes [provider]  pantoprazole (PROTONIX) 40 MG tablet Take 40 mg by mouth daily.   Yes [provider]  sertraline (ZOLOFT) 25 MG tablet Take 25 mg by mouth daily.   Yes [provider]  simvastatin (ZOCOR) 40 MG tablet Take 40 mg by mouth every evening.   Yes [provider]  COVID-19 mRNA Vac-TriS, Pfizer, (PFIZER-BIONT COVID-19 VAC-TRIS) SUSP injection Inject into the muscle. Patient not taking: Reported on 04/12/2023 03/16/21   Judyann Munson, MD  Multiple  Vitamins-Minerals (MULTIVITAMIN PO) Take 0.5 tablets by mouth daily. Patient not taking: Reported on 04/12/2023    [provider]      Allergies    Patient has no known allergies.    Review of Systems   Review of Systems  All other systems reviewed and are negative.   Physical Exam Updated Vital Signs BP 126/72   Pulse 72   Temp (!) 97.5 F (36.4 C) (Oral)   Resp 13   Ht 5\' 3"  (1.6 m)   Wt 66.7 kg   SpO2 100%   BMI 26.04 kg/m  Physical Exam Vitals and nursing note reviewed.  Constitutional:      General: She is not in acute distress.    Appearance: Normal appearance.  HENT:     Head: Normocephalic and atraumatic.  Eyes:     General:        Right eye: No discharge.        Left eye: No discharge.  Cardiovascular:     Rate and Rhythm: Normal rate and regular rhythm.     Heart sounds: No murmur heard.    No friction rub. No gallop.  Pulmonary:     Effort: Pulmonary effort is normal.     Breath sounds: Normal breath sounds.  Abdominal:     General: Bowel sounds are normal.     Palpations: Abdomen is soft.  Musculoskeletal:     Cervical back: Neck supple. No rigidity or tenderness.     Comments: No step-off, deformity of all 4  extremities.  She has not tenderness over the left hip, into the left mid hand, with no deformity of any of the digits, intact strength to flexion, extension of the left wrist.  She has a small red mark and some tenderness over the left maxilla.  Skin:    General: Skin is warm and dry.     Capillary Refill: Capillary refill takes less than 2 seconds.  Neurological:     Mental Status: She is alert and oriented to person, place, and time.     Comments: Cranial nerves II through XII grossly intact.  Intact finger-nose, intact heel-to-shin.  Romberg negative, gait normal.  Alert and oriented x3.  Moves all 4 limbs spontaneously, normal coordination.  No pronator drift.  Intact strength 5 out of 5 bilateral upper and lower extremities.     Psychiatric:        Mood and Affect: Mood normal.        Behavior: Behavior normal.     ED Results / Procedures / Treatments   Labs (all labs ordered are listed, but only abnormal results are displayed) Labs Reviewed  CBC WITH DIFFERENTIAL/PLATELET - Abnormal; Notable for the following components:      Result Value   RBC 3.73 (*)    Hemoglobin 11.5 (*)    HCT 34.6 (*)    All other components within normal limits  BASIC METABOLIC PANEL - Abnormal; Notable for the following components:   Glucose, Bld 103 (*)    Creatinine, Ser 1.13 (*)    GFR, Estimated 49 (*)    All other components within normal limits  COMPREHENSIVE METABOLIC PANEL  CBC  TROPONIN I (HIGH SENSITIVITY)  TROPONIN I (HIGH SENSITIVITY)    EKG None  Radiology DG Hip Unilat W or Wo Pelvis 2-3 Views Left  Result Date: 04/12/2023 CLINICAL DATA:  Trauma, fall EXAM: DG HIP (WITH OR WITHOUT PELVIS) 2-3V LEFT COMPARISON:  None Available. FINDINGS: No recent fracture or dislocation is seen. Deformities are noted both pubic bones, possibly residual from previous injury. Inferior vena cava filter is seen. There is a surgical clip in the pelvis. IMPRESSION: No recent fracture or dislocation is seen left hip. Deformities in both pubic bones may be residual from previous injury. Electronically Signed   By: Ernie Avena M.D.   On: 04/12/2023 16:07   CT Head Wo Contrast  Result Date: 04/12/2023 CLINICAL DATA:  Status post fall after feeling dizzy. EXAM: CT HEAD WITHOUT CONTRAST CT MAXILLOFACIAL WITHOUT CONTRAST CT CERVICAL SPINE WITHOUT CONTRAST TECHNIQUE: Multidetector CT imaging of the head, cervical spine, and maxillofacial structures were performed using the standard protocol without intravenous contrast. Multiplanar CT image reconstructions of the cervical spine and maxillofacial structures were also generated. RADIATION DOSE REDUCTION: This exam was performed according to the departmental dose-optimization program which  includes automated exposure control, adjustment of the mA and/or kV according to patient size and/or use of iterative reconstruction technique. COMPARISON:  03/09/2022 FINDINGS: CT HEAD FINDINGS Brain: Acute left anterior parafalcine subdural hemorrhage measuring 3 mm in thickness. No evidence of acute infarction, ventriculomegaly, or mass effect. Generalized cerebral atrophy. Periventricular white matter low attenuation likely secondary to microangiopathy. Vascular: Cerebrovascular atherosclerotic calcifications are noted. Skull: Negative for fracture or focal lesion. Sinuses/Orbits: Visualized portions of the orbits are unremarkable. Visualized portions of the paranasal sinuses are unremarkable. Visualized portions of the mastoid air cells are unremarkable. Other: None. CT MAXILLOFACIAL FINDINGS Osseous: No fracture or mandibular dislocation. No destructive process. Orbits: Negative. No traumatic or inflammatory  finding. Sinuses: Clear. Soft tissues: Negative. CT CERVICAL SPINE FINDINGS Alignment: Anatomic alignment.  No static listhesis. Skull base and vertebrae: No acute fracture. No aggressive lytic or sclerotic osseous lesion. Soft tissues and spinal canal: Intraspinal soft tissues are not fully imaged on this examination due to poor soft tissue contrast, but there is no gross soft tissue abnormality. No prevertebral fluid or swelling. No visible canal hematoma. Disc levels: Degenerative disease with disc height loss at C4-5 and C5-6. Small central disc protrusion at C2-3. Mild broad-based disc bulge at C3-4. Mild broad-based disc osteophyte complex C4-5 with bilateral uncovertebral degenerative changes and mild right foraminal stenosis. At C5-6 there is a broad-based disc osteophyte complex, bilateral uncovertebral degenerative changes and mild right foraminal stenosis. Upper chest: Lung apices are clear. Other: No fluid collection or hematoma. IMPRESSION: 1. Acute left anterior parafalcine subdural  hemorrhage measuring 3 mm in thickness. No midline shift. 2. No acute facial bone fracture. 3. No acute osseous injury of the cervical spine. Critical Value/emergent results were called by telephone at the time of interpretation on 04/12/2023 at 3:40 pm to provider [facility] , who verbally acknowledged these results. Electronically Signed   By: Elige Ko M.D.   On: 04/12/2023 15:43   CT Maxillofacial Wo Contrast  Result Date: 04/12/2023 CLINICAL DATA:  Status post fall after feeling dizzy. EXAM: CT HEAD WITHOUT CONTRAST CT MAXILLOFACIAL WITHOUT CONTRAST CT CERVICAL SPINE WITHOUT CONTRAST TECHNIQUE: Multidetector CT imaging of the head, cervical spine, and maxillofacial structures were performed using the standard protocol without intravenous contrast. Multiplanar CT image reconstructions of the cervical spine and maxillofacial structures were also generated. RADIATION DOSE REDUCTION: This exam was performed according to the departmental dose-optimization program which includes automated exposure control, adjustment of the mA and/or kV according to patient size and/or use of iterative reconstruction technique. COMPARISON:  03/09/2022 FINDINGS: CT HEAD FINDINGS Brain: Acute left anterior parafalcine subdural hemorrhage measuring 3 mm in thickness. No evidence of acute infarction, ventriculomegaly, or mass effect. Generalized cerebral atrophy. Periventricular white matter low attenuation likely secondary to microangiopathy. Vascular: Cerebrovascular atherosclerotic calcifications are noted. Skull: Negative for fracture or focal lesion. Sinuses/Orbits: Visualized portions of the orbits are unremarkable. Visualized portions of the paranasal sinuses are unremarkable. Visualized portions of the mastoid air cells are unremarkable. Other: None. CT MAXILLOFACIAL FINDINGS Osseous: No fracture or mandibular dislocation. No destructive process. Orbits: Negative. No traumatic or inflammatory finding. Sinuses: Clear.  Soft tissues: Negative. CT CERVICAL SPINE FINDINGS Alignment: Anatomic alignment.  No static listhesis. Skull base and vertebrae: No acute fracture. No aggressive lytic or sclerotic osseous lesion. Soft tissues and spinal canal: Intraspinal soft tissues are not fully imaged on this examination due to poor soft tissue contrast, but there is no gross soft tissue abnormality. No prevertebral fluid or swelling. No visible canal hematoma. Disc levels: Degenerative disease with disc height loss at C4-5 and C5-6. Small central disc protrusion at C2-3. Mild broad-based disc bulge at C3-4. Mild broad-based disc osteophyte complex C4-5 with bilateral uncovertebral degenerative changes and mild right foraminal stenosis. At C5-6 there is a broad-based disc osteophyte complex, bilateral uncovertebral degenerative changes and mild right foraminal stenosis. Upper chest: Lung apices are clear. Other: No fluid collection or hematoma. IMPRESSION: 1. Acute left anterior parafalcine subdural hemorrhage measuring 3 mm in thickness. No midline shift. 2. No acute facial bone fracture. 3. No acute osseous injury of the cervical spine. Critical Value/emergent results were called by telephone at the time of interpretation on 04/12/2023 at 3:40 pm  to provider [facility] , who verbally acknowledged these results. Electronically Signed   By: Elige Ko M.D.   On: 04/12/2023 15:43   CT Cervical Spine Wo Contrast  Result Date: 04/12/2023 CLINICAL DATA:  Status post fall after feeling dizzy. EXAM: CT HEAD WITHOUT CONTRAST CT MAXILLOFACIAL WITHOUT CONTRAST CT CERVICAL SPINE WITHOUT CONTRAST TECHNIQUE: Multidetector CT imaging of the head, cervical spine, and maxillofacial structures were performed using the standard protocol without intravenous contrast. Multiplanar CT image reconstructions of the cervical spine and maxillofacial structures were also generated. RADIATION DOSE REDUCTION: This exam was performed according to the  departmental dose-optimization program which includes automated exposure control, adjustment of the mA and/or kV according to patient size and/or use of iterative reconstruction technique. COMPARISON:  03/09/2022 FINDINGS: CT HEAD FINDINGS Brain: Acute left anterior parafalcine subdural hemorrhage measuring 3 mm in thickness. No evidence of acute infarction, ventriculomegaly, or mass effect. Generalized cerebral atrophy. Periventricular white matter low attenuation likely secondary to microangiopathy. Vascular: Cerebrovascular atherosclerotic calcifications are noted. Skull: Negative for fracture or focal lesion. Sinuses/Orbits: Visualized portions of the orbits are unremarkable. Visualized portions of the paranasal sinuses are unremarkable. Visualized portions of the mastoid air cells are unremarkable. Other: None. CT MAXILLOFACIAL FINDINGS Osseous: No fracture or mandibular dislocation. No destructive process. Orbits: Negative. No traumatic or inflammatory finding. Sinuses: Clear. Soft tissues: Negative. CT CERVICAL SPINE FINDINGS Alignment: Anatomic alignment.  No static listhesis. Skull base and vertebrae: No acute fracture. No aggressive lytic or sclerotic osseous lesion. Soft tissues and spinal canal: Intraspinal soft tissues are not fully imaged on this examination due to poor soft tissue contrast, but there is no gross soft tissue abnormality. No prevertebral fluid or swelling. No visible canal hematoma. Disc levels: Degenerative disease with disc height loss at C4-5 and C5-6. Small central disc protrusion at C2-3. Mild broad-based disc bulge at C3-4. Mild broad-based disc osteophyte complex C4-5 with bilateral uncovertebral degenerative changes and mild right foraminal stenosis. At C5-6 there is a broad-based disc osteophyte complex, bilateral uncovertebral degenerative changes and mild right foraminal stenosis. Upper chest: Lung apices are clear. Other: No fluid collection or hematoma. IMPRESSION: 1. Acute  left anterior parafalcine subdural hemorrhage measuring 3 mm in thickness. No midline shift. 2. No acute facial bone fracture. 3. No acute osseous injury of the cervical spine. Critical Value/emergent results were called by telephone at the time of interpretation on 04/12/2023 at 3:40 pm to provider [facility] , who verbally acknowledged these results. Electronically Signed   By: Elige Ko M.D.   On: 04/12/2023 15:43   DG Wrist Complete Left  Result Date: 04/12/2023 CLINICAL DATA:  Dizziness, fall, pain. EXAM: LEFT WRIST - COMPLETE 3+ VIEW COMPARISON:  None Available. FINDINGS: Osseous alignment is normal. No fracture line or displaced fracture fragment. Mild degenerative change at the first [facility] joint. No large osteophytes or other signs of advanced DJD. IMPRESSION: 1. No acute findings. No fracture or dislocation. 2. Mild degenerative change at the first [facility] joint. Electronically Signed   By: Bary Richard M.D.   On: 04/12/2023 15:32   DG Hand Complete Left  Result Date: 04/12/2023 CLINICAL DATA:  Dizziness, fall. EXAM: LEFT HAND - COMPLETE 3+ VIEW COMPARISON:  None Available. FINDINGS: No fracture line or displaced fracture fragment. Mild degenerative change at the first [facility] joint and majority of the IP joints. No large osteophytes or other signs of advanced DJD. Soft tissues about the LEFT hand are unremarkable. IMPRESSION: 1. No acute findings. No fracture or dislocation.  2. Mild degenerative change. Electronically Signed   By: Bary Richard M.D.   On: 04/12/2023 15:31   DG Chest Port 1 View  Result Date: 04/12/2023 CLINICAL DATA:  Dizziness, fall EXAM: PORTABLE CHEST 1 VIEW COMPARISON:  Chest x-ray dated 12/23/2018 FINDINGS: Heart size and mediastinal contours are stable. Chronic scarring/fibrosis of the LEFT lung. Lungs are otherwise clear. No pleural effusion or pneumothorax is seen. Old healed fractures of multiple LEFT-sided ribs. No acute-appearing osseous abnormality seen. IMPRESSION: 1.  No active disease. No evidence of pneumonia or pulmonary edema. 2. Chronic scarring/fibrosis of the LEFT lung. Electronically Signed   By: Bary Richard M.D.   On: 04/12/2023 15:19    Procedures Procedures    Medications Ordered in ED Medications  simvastatin (ZOCOR) tablet 40 mg (has no administration in time range)  sertraline (ZOLOFT) tablet 25 mg (has no administration in time range)  pantoprazole (PROTONIX) EC tablet 40 mg (has no administration in time range)  sodium chloride flush (NS) 0.9 % injection 3 mL (has no administration in time range)  acetaminophen (TYLENOL) tablet 650 mg (has no administration in time range)    Or  acetaminophen (TYLENOL) suppository 650 mg (has no administration in time range)  polyethylene glycol (MIRALAX / GLYCOLAX) packet 17 g (has no administration in time range)  acetaminophen (TYLENOL) tablet 650 mg (650 mg Oral Given 04/12/23 1556)  sodium chloride 0.9 % bolus 1,000 mL (1,000 mLs Intravenous New Bag/Given 04/12/23 1556)    ED Course/ Medical Decision Making/ A&P Clinical Course as of 04/12/23 1718  Thu Apr 12, 2023  1542 Acute subdural along anterior falx measuring 3mm -- c spine and face are clear [HN]    Clinical Course User Index [HN] Loetta Rough, MD                             Medical Decision Making Amount and/or Complexity of Data Reviewed Radiology: ordered.  Risk OTC drugs.   This patient is a 82 y.o. female  who presents to the ED for concern of fall, head injury.   Differential diagnoses prior to evaluation: The emergent differential diagnosis includes, but is not limited to, skull fracture, ICH, epidural or subdural hematoma, subarachnoid hemorrhage, facial bone fracture, hip fracture, or other musculoskeletal injury, cardiogenic or neurogenic syncope as the initial etiology for the fall. This is not an exhaustive differential.   Past Medical History / Co-morbidities / Social History: GERD, HTN, previous TIA and CVA, on  plavix, HLD  Physical Exam: Physical exam performed. The pertinent findings include: No step-off, deformity of all 4 extremities.  She has not tenderness over the left hip, into the left mid hand, with no deformity of any of the digits, intact strength to flexion, extension of the left wrist.  She has a small red mark and some tenderness over the left maxilla.  Cranial nerves II through XII grossly intact.  Intact finger-nose, intact heel-to-shin.  Romberg negative, gait normal.  Alert and oriented x3.  Moves all 4 limbs spontaneously, normal coordination.  No pronator drift.  Intact strength 5 out of 5 bilateral upper and lower extremities.  Vital signs overall stable, afebrile with normal heart rate and rhythm, stable oxygen saturation on room air.  She was mildly hypertensive on arrival, blood pressure 148/72.  Lab Tests/Imaging studies: I personally interpreted labs/imaging and the pertinent results include: BMP overall unremarkable, mildly elevated creatinine at 1.13, not significantly changed from patient's  baseline.  CBC with mild anemia, hemoglobin 11.5.  Initial troponin at 3 in context of patient with no active chest pain at time my evaluation just an unclear cause possible dizziness, syncope. I independently interpreted plain film radiographs of the left hip, left hand, chest, as well as CT of the head, maxillofacial, and C-spine, no acute fracture, dislocation, she does have a acute subdural hematoma along anterior falx measuring 3 mm. I agree with the radiologist interpretation.  Cardiac monitoring: EKG obtained and interpreted by myself and attending physician which shows: NSR   Medications: I ordered medication including we will hold patient's Plavix at this time, administered small fluid bolus secondary to dizziness, and gave Tylenol for muscular pain..  I have reviewed the patients home medicines and have made adjustments as needed.   Consults: With the neurosurgeon, Dr. Conchita Paris  and after reviewing the patient's clinical condition and imaging he recommended holding patient's Plavix, and repeat head CT in 12 hours.  Spoke with the hospitalist, Dr. Alinda Money who after discussion of patient's lab work, imaging findings, and clinical condition and agrees to admission for acute subdural hematoma with plan for repeat imaging in 12 hours.  Disposition: After consideration of the diagnostic results and the patients response to treatment, I feel that patient would benefit from admission for acute subdural hematoma after fall and tenderness.   emergency department workup does not suggest an emergent condition requiring admission or immediate intervention beyond what has been performed at this time. The plan is: as above. The patient is safe for discharge and has been instructed to return immediately for worsening symptoms, change in symptoms or any other concerns.  Final Clinical Impression(s) / ED Diagnoses Final diagnoses:  Subdural hematoma [facility])    Rx / DC Orders ED Discharge Orders     None         Olene Floss, PA-C 04/12/23 1718    Loetta Rough, MD 04/12/23 [phone]

## 2023-04-12 NOTE — Plan of Care (Signed)

## 2023-04-12 NOTE — Progress Notes (Addendum)
   04/12/23 1505  Spiritual Encounters  Type of Visit Initial  Care provided to: Pt and family  Conversation partners present during encounter Nurse  Referral source Trauma page  Reason for visit Trauma  OnCall Visit No  Spiritual Framework  Presenting Themes Coping tools;Rituals and practive  Interventions  Spiritual Care Interventions Made Prayer;Compassionate presence;Established relationship of care and support;Encouragement  Intervention Outcomes  Outcomes Reduced anxiety   Responded to trauma page for patient who fell while on thinners. Patient's husband was sitting in push chair outside of the room while medical team attended to patient. Chaplain had a conversation with patient spouse Rayna Sexton while he waited. Spouse advised chaplain if what happened to patient. When room was clear and nurse stated patient was ready for spouse's visit, chaplain pushed spouses chair into the room and rested it at her bed side. Patient was very cheerful. Patient also requested prayer. Tech entered room requesting blood from patient. Patient was afraid and became concerned. Chaplain continued the conversation and distracted patient as tech drew blood. Patient thanked chaplain stating "you distracted me", and began laughing. Chaplain thanked patient and spouse for allowing chaplain to accompany them in their journey and asked if there was anything else that the chaplain could offer. Spouses stated, "just keep praying for [place]." Doctor entered the room and chaplain excused herself from the room.       Chaplains [place] and [place]

## 2023-04-12 NOTE — ED Triage Notes (Signed)
On Plavix. Pt was walking in her kitchen when she got dizzy and fell on hardwood floors. Denies LOC. No obvious deformity. Complains of a headache and left hand pain. Alert and oriented x 4.

## 2023-04-12 NOTE — ED Notes (Signed)
ED TO INPATIENT HANDOFF REPORT  ED Nurse Name and Phone #: Sheilah Mins [phone]  S Name/Age/Gender Chelsey Haas 82 y.o. female Room/Bed: RESUSC/RESUSC  Code Status   Code Status: Full Code  Home/SNF/Other Home Patient oriented to: self, place, time, and situation Is this baseline? Yes   Triage Complete: Triage complete  Chief Complaint Subdural hematoma (HCC) [S06.5XAA]  Triage Note On Plavix. Pt was walking in her kitchen when she got dizzy and fell on hardwood floors. Denies LOC. No obvious deformity. Complains of a headache and left hand pain. Alert and oriented x 4.    Allergies No Known Allergies  Level of Care/Admitting Diagnosis ED Disposition     ED Disposition  Admit   Condition  --   Comment  Hospital Area: [facility] [[id number]]  Level of Care: Telemetry Surgical [[id number]]  May place patient in observation at [facility] or [facility] if equivalent level of care is available:: No  Covid Evaluation: Asymptomatic - no recent exposure (last 10 days) testing not required  Diagnosis: Subdural hematoma [facility]) [[id number]]  Admitting Physician: Synetta Fail [[id number]]  Attending Physician: Synetta Fail [phone]          B Medical/Surgery History Past Medical History:  Diagnosis Date   Acid reflux    High cholesterol    Hypertension    Stroke (HCC)    Vertigo    Past Surgical History:  Procedure Laterality Date   ABDOMINAL HYSTERECTOMY     CHOLECYSTECTOMY     LEG SURGERY       A IV Location/Drains/Wounds Patient Lines/Drains/Airways Status     Active Line/Drains/Airways     Name Placement date Placement time Site Days   Peripheral IV 04/12/23 20 G Right Antecubital 04/12/23  1502  Antecubital  less than 1            Intake/Output Last 24 hours No intake or output data in the 24 hours ending 04/12/23 1653  Labs/Imaging Results for orders placed or performed during the hospital encounter of 04/12/23 (from  the past 48 hour(s))  CBC with Differential/Platelet     Status: Abnormal   Collection Time: 04/12/23  3:03 PM  Result Value Ref Range   WBC 4.2 4.0 - 10.5 K/uL   RBC 3.73 (L) 3.87 - 5.11 MIL/uL   Hemoglobin 11.5 (L) 12.0 - 15.0 g/dL   HCT 08.6 (L) 57.8 - 46.9 %   MCV 92.8 80.0 - 100.0 fL   MCH 30.8 26.0 - 34.0 pg   MCHC 33.2 30.0 - 36.0 g/dL   RDW 62.9 52.8 - 41.3 %   Platelets 255 150 - 400 K/uL   nRBC 0.0 0.0 - 0.2 %   Neutrophils Relative % 69 %   Neutro Abs 2.9 1.7 - 7.7 K/uL   Lymphocytes Relative 19 %   Lymphs Abs 0.8 0.7 - 4.0 K/uL   Monocytes Relative 10 %   Monocytes Absolute 0.4 0.1 - 1.0 K/uL   Eosinophils Relative 1 %   Eosinophils Absolute 0.0 0.0 - 0.5 K/uL   Basophils Relative 1 %   Basophils Absolute 0.1 0.0 - 0.1 K/uL   Immature Granulocytes 0 %   Abs Immature Granulocytes 0.01 0.00 - 0.07 K/uL    Comment: Performed at [facility], [address] [place], [place] [address]  Basic metabolic panel     Status: Abnormal   Collection Time: 04/12/23  3:03 PM  Result Value Ref Range   Sodium 137 135 -  145 mmol/L   Potassium 4.3 3.5 - 5.1 mmol/L    Comment: HEMOLYSIS AT THIS LEVEL MAY AFFECT RESULT   Chloride 99 98 - 111 mmol/L   CO2 24 22 - 32 mmol/L   Glucose, Bld 103 (H) 70 - 99 mg/dL    Comment: Glucose reference range applies only to samples taken after fasting for at least 8 hours.   BUN 12 8 - 23 mg/dL   Creatinine, Ser 2.95 (H) 0.44 - 1.00 mg/dL   Calcium 9.6 8.9 - 62.1 mg/dL   GFR, Estimated 49 (L) >60 mL/min    Comment: (NOTE) Calculated using the CKD-EPI Creatinine Equation (2021)    Anion gap 14 5 - 15    Comment: Performed at [facility], [address] [place], [place] [address]  Troponin I (High Sensitivity)     Status: None   Collection Time: 04/12/23  3:11 PM  Result Value Ref Range   Troponin I (High Sensitivity) 3 <18 ng/L    Comment: (NOTE) Elevated high sensitivity troponin I (hsTnI) values and significant  changes  across serial measurements may suggest ACS but many other  chronic and acute conditions are known to elevate hsTnI results.  Refer to the "Links" section for chest pain algorithms and additional  guidance. Performed at [facility], [address] [place], [place] [address]    DG Hip Lucienne Capers or Wo Pelvis 2-3 Views Left  Result Date: 04/12/2023 CLINICAL DATA:  Trauma, fall EXAM: DG HIP (WITH OR WITHOUT PELVIS) 2-3V LEFT COMPARISON:  None Available. FINDINGS: No recent fracture or dislocation is seen. Deformities are noted both pubic bones, possibly residual from previous injury. Inferior vena cava filter is seen. There is a surgical clip in the pelvis. IMPRESSION: No recent fracture or dislocation is seen left hip. Deformities in both pubic bones may be residual from previous injury. Electronically Signed   By: Ernie Avena M.D.   On: 04/12/2023 16:07   CT Head Wo Contrast  Result Date: 04/12/2023 CLINICAL DATA:  Status post fall after feeling dizzy. EXAM: CT HEAD WITHOUT CONTRAST CT MAXILLOFACIAL WITHOUT CONTRAST CT CERVICAL SPINE WITHOUT CONTRAST TECHNIQUE: Multidetector CT imaging of the head, cervical spine, and maxillofacial structures were performed using the standard protocol without intravenous contrast. Multiplanar CT image reconstructions of the cervical spine and maxillofacial structures were also generated. RADIATION DOSE REDUCTION: This exam was performed according to the departmental dose-optimization program which includes automated exposure control, adjustment of the Lasaro Primm and/or kV according to patient size and/or use of iterative reconstruction technique. COMPARISON:  03/09/2022 FINDINGS: CT HEAD FINDINGS Brain: Acute left anterior parafalcine subdural hemorrhage measuring 3 mm in thickness. No evidence of acute infarction, ventriculomegaly, or mass effect. Generalized cerebral atrophy. Periventricular white matter low attenuation likely secondary to microangiopathy.  Vascular: Cerebrovascular atherosclerotic calcifications are noted. Skull: Negative for fracture or focal lesion. Sinuses/Orbits: Visualized portions of the orbits are unremarkable. Visualized portions of the paranasal sinuses are unremarkable. Visualized portions of the mastoid air cells are unremarkable. Other: None. CT MAXILLOFACIAL FINDINGS Osseous: No fracture or mandibular dislocation. No destructive process. Orbits: Negative. No traumatic or inflammatory finding. Sinuses: Clear. Soft tissues: Negative. CT CERVICAL SPINE FINDINGS Alignment: Anatomic alignment.  No static listhesis. Skull base and vertebrae: No acute fracture. No aggressive lytic or sclerotic osseous lesion. Soft tissues and spinal canal: Intraspinal soft tissues are not fully imaged on this examination due to poor soft tissue contrast, but there is no gross soft tissue abnormality. No  prevertebral fluid or swelling. No visible canal hematoma. Disc levels: Degenerative disease with disc height loss at C4-5 and C5-6. Small central disc protrusion at C2-3. Mild broad-based disc bulge at C3-4. Mild broad-based disc osteophyte complex C4-5 with bilateral uncovertebral degenerative changes and mild right foraminal stenosis. At C5-6 there is a broad-based disc osteophyte complex, bilateral uncovertebral degenerative changes and mild right foraminal stenosis. Upper chest: Lung apices are clear. Other: No fluid collection or hematoma. IMPRESSION: 1. Acute left anterior parafalcine subdural hemorrhage measuring 3 mm in thickness. No midline shift. 2. No acute facial bone fracture. 3. No acute osseous injury of the cervical spine. Critical Value/emergent results were called by telephone at the time of interpretation on 04/12/2023 at 3:40 pm to provider [facility] , who verbally acknowledged these results. Electronically Signed   By: Elige Ko M.D.   On: 04/12/2023 15:43   CT Maxillofacial Wo Contrast  Result Date: 04/12/2023 CLINICAL DATA:   Status post fall after feeling dizzy. EXAM: CT HEAD WITHOUT CONTRAST CT MAXILLOFACIAL WITHOUT CONTRAST CT CERVICAL SPINE WITHOUT CONTRAST TECHNIQUE: Multidetector CT imaging of the head, cervical spine, and maxillofacial structures were performed using the standard protocol without intravenous contrast. Multiplanar CT image reconstructions of the cervical spine and maxillofacial structures were also generated. RADIATION DOSE REDUCTION: This exam was performed according to the departmental dose-optimization program which includes automated exposure control, adjustment of the Anielle Headrick and/or kV according to patient size and/or use of iterative reconstruction technique. COMPARISON:  03/09/2022 FINDINGS: CT HEAD FINDINGS Brain: Acute left anterior parafalcine subdural hemorrhage measuring 3 mm in thickness. No evidence of acute infarction, ventriculomegaly, or mass effect. Generalized cerebral atrophy. Periventricular white matter low attenuation likely secondary to microangiopathy. Vascular: Cerebrovascular atherosclerotic calcifications are noted. Skull: Negative for fracture or focal lesion. Sinuses/Orbits: Visualized portions of the orbits are unremarkable. Visualized portions of the paranasal sinuses are unremarkable. Visualized portions of the mastoid air cells are unremarkable. Other: None. CT MAXILLOFACIAL FINDINGS Osseous: No fracture or mandibular dislocation. No destructive process. Orbits: Negative. No traumatic or inflammatory finding. Sinuses: Clear. Soft tissues: Negative. CT CERVICAL SPINE FINDINGS Alignment: Anatomic alignment.  No static listhesis. Skull base and vertebrae: No acute fracture. No aggressive lytic or sclerotic osseous lesion. Soft tissues and spinal canal: Intraspinal soft tissues are not fully imaged on this examination due to poor soft tissue contrast, but there is no gross soft tissue abnormality. No prevertebral fluid or swelling. No visible canal hematoma. Disc levels: Degenerative  disease with disc height loss at C4-5 and C5-6. Small central disc protrusion at C2-3. Mild broad-based disc bulge at C3-4. Mild broad-based disc osteophyte complex C4-5 with bilateral uncovertebral degenerative changes and mild right foraminal stenosis. At C5-6 there is a broad-based disc osteophyte complex, bilateral uncovertebral degenerative changes and mild right foraminal stenosis. Upper chest: Lung apices are clear. Other: No fluid collection or hematoma. IMPRESSION: 1. Acute left anterior parafalcine subdural hemorrhage measuring 3 mm in thickness. No midline shift. 2. No acute facial bone fracture. 3. No acute osseous injury of the cervical spine. Critical Value/emergent results were called by telephone at the time of interpretation on 04/12/2023 at 3:40 pm to provider [facility] , who verbally acknowledged these results. Electronically Signed   By: Elige Ko M.D.   On: 04/12/2023 15:43   CT Cervical Spine Wo Contrast  Result Date: 04/12/2023 CLINICAL DATA:  Status post fall after feeling dizzy. EXAM: CT HEAD WITHOUT CONTRAST CT MAXILLOFACIAL WITHOUT CONTRAST CT CERVICAL SPINE WITHOUT CONTRAST TECHNIQUE: Multidetector CT  imaging of the head, cervical spine, and maxillofacial structures were performed using the standard protocol without intravenous contrast. Multiplanar CT image reconstructions of the cervical spine and maxillofacial structures were also generated. RADIATION DOSE REDUCTION: This exam was performed according to the departmental dose-optimization program which includes automated exposure control, adjustment of the Jaycee Pelzer and/or kV according to patient size and/or use of iterative reconstruction technique. COMPARISON:  03/09/2022 FINDINGS: CT HEAD FINDINGS Brain: Acute left anterior parafalcine subdural hemorrhage measuring 3 mm in thickness. No evidence of acute infarction, ventriculomegaly, or mass effect. Generalized cerebral atrophy. Periventricular white matter low attenuation  likely secondary to microangiopathy. Vascular: Cerebrovascular atherosclerotic calcifications are noted. Skull: Negative for fracture or focal lesion. Sinuses/Orbits: Visualized portions of the orbits are unremarkable. Visualized portions of the paranasal sinuses are unremarkable. Visualized portions of the mastoid air cells are unremarkable. Other: None. CT MAXILLOFACIAL FINDINGS Osseous: No fracture or mandibular dislocation. No destructive process. Orbits: Negative. No traumatic or inflammatory finding. Sinuses: Clear. Soft tissues: Negative. CT CERVICAL SPINE FINDINGS Alignment: Anatomic alignment.  No static listhesis. Skull base and vertebrae: No acute fracture. No aggressive lytic or sclerotic osseous lesion. Soft tissues and spinal canal: Intraspinal soft tissues are not fully imaged on this examination due to poor soft tissue contrast, but there is no gross soft tissue abnormality. No prevertebral fluid or swelling. No visible canal hematoma. Disc levels: Degenerative disease with disc height loss at C4-5 and C5-6. Small central disc protrusion at C2-3. Mild broad-based disc bulge at C3-4. Mild broad-based disc osteophyte complex C4-5 with bilateral uncovertebral degenerative changes and mild right foraminal stenosis. At C5-6 there is a broad-based disc osteophyte complex, bilateral uncovertebral degenerative changes and mild right foraminal stenosis. Upper chest: Lung apices are clear. Other: No fluid collection or hematoma. IMPRESSION: 1. Acute left anterior parafalcine subdural hemorrhage measuring 3 mm in thickness. No midline shift. 2. No acute facial bone fracture. 3. No acute osseous injury of the cervical spine. Critical Value/emergent results were called by telephone at the time of interpretation on 04/12/2023 at 3:40 pm to provider [facility] , who verbally acknowledged these results. Electronically Signed   By: Elige Ko M.D.   On: 04/12/2023 15:43   DG Wrist Complete Left  Result  Date: 04/12/2023 CLINICAL DATA:  Dizziness, fall, pain. EXAM: LEFT WRIST - COMPLETE 3+ VIEW COMPARISON:  None Available. FINDINGS: Osseous alignment is normal. No fracture line or displaced fracture fragment. Mild degenerative change at the first [facility] joint. No large osteophytes or other signs of advanced DJD. IMPRESSION: 1. No acute findings. No fracture or dislocation. 2. Mild degenerative change at the first [facility] joint. Electronically Signed   By: Bary Richard M.D.   On: 04/12/2023 15:32   DG Hand Complete Left  Result Date: 04/12/2023 CLINICAL DATA:  Dizziness, fall. EXAM: LEFT HAND - COMPLETE 3+ VIEW COMPARISON:  None Available. FINDINGS: No fracture line or displaced fracture fragment. Mild degenerative change at the first [facility] joint and majority of the IP joints. No large osteophytes or other signs of advanced DJD. Soft tissues about the LEFT hand are unremarkable. IMPRESSION: 1. No acute findings. No fracture or dislocation. 2. Mild degenerative change. Electronically Signed   By: Bary Richard M.D.   On: 04/12/2023 15:31   DG Chest Port 1 View  Result Date: 04/12/2023 CLINICAL DATA:  Dizziness, fall EXAM: PORTABLE CHEST 1 VIEW COMPARISON:  Chest x-ray dated 12/23/2018 FINDINGS: Heart size and mediastinal contours are stable. Chronic scarring/fibrosis of the LEFT lung. Lungs are  otherwise clear. No pleural effusion or pneumothorax is seen. Old healed fractures of multiple LEFT-sided ribs. No acute-appearing osseous abnormality seen. IMPRESSION: 1. No active disease. No evidence of pneumonia or pulmonary edema. 2. Chronic scarring/fibrosis of the LEFT lung. Electronically Signed   By: Bary Richard M.D.   On: 04/12/2023 15:19    Pending Labs Unresulted Labs (From admission, onward)     Start     Ordered   04/13/23 0500  Comprehensive metabolic panel  Tomorrow morning,   R        04/12/23 1627   04/13/23 0500  CBC  Tomorrow morning,   R        04/12/23 1627            Vitals/Pain Today's  Vitals   04/12/23 1443 04/12/23 1446 04/12/23 1545 04/12/23 1600  BP:  (!) 148/72  126/72  Pulse:  75 71 72  Resp:  12 11 13   Temp:  (!) 97.5 F (36.4 C)    TempSrc:  Oral    SpO2:  99% 99% 100%  Weight: 66.7 kg     Height: 5\' 3"  (1.6 m)     PainSc: 6        Isolation Precautions No active isolations  Medications Medications  lisinopril-hydrochlorothiazide (ZESTORETIC) 20-25 MG per tablet 1 tablet (has no administration in time range)  simvastatin (ZOCOR) tablet 40 mg (has no administration in time range)  sertraline (ZOLOFT) tablet 25 mg (has no administration in time range)  pantoprazole (PROTONIX) EC tablet 40 mg (has no administration in time range)  sodium chloride flush (NS) 0.9 % injection 3 mL (has no administration in time range)  acetaminophen (TYLENOL) tablet 650 mg (has no administration in time range)    Or  acetaminophen (TYLENOL) suppository 650 mg (has no administration in time range)  polyethylene glycol (MIRALAX / GLYCOLAX) packet 17 g (has no administration in time range)  acetaminophen (TYLENOL) tablet 650 mg (650 mg Oral Given 04/12/23 1556)  sodium chloride 0.9 % bolus 1,000 mL (1,000 mLs Intravenous New Bag/Given 04/12/23 1556)    Mobility walks     Focused Assessments Neuro Assessment Handoff:  Swallow screen pass? Yes  Cardiac Rhythm: Normal sinus rhythm       Neuro Assessment:   Neuro Checks:      Has TPA been given? No If patient is a Neuro Trauma and patient is going to OR before floor call report to 4N Charge nurse: [phone] or [phone]   R Recommendations: See Admitting Provider Note  Report given to:   Additional Notes:

## 2023-04-12 NOTE — H&P (Signed)
History and Physical   Nirali Turnbaugh ZOX:[id number] DOB: Nov 27, 1940 DOA: 04/12/2023  PCP: Pcp, No   Patient coming from: Home  Chief Complaint: Fall  HPI: Chelsey Haas is a 82 y.o. female with medical history significant of hypertension, hyperlipidemia, stroke, GERD presenting after a fall at home.   patient was walking in her kitchen when she became dizzy and fell and landed on her hardwood floors.  Denies significant prodrome.  Reports that she has had some increasing frequency of dizziness for the past month or two.  Describes her dizziness as off balance/dizziness, but not light headedness..  Does not feel that she is getting dehydrated, states she drinks plenty of fluids.  Denies fevers, chills, chest pain, shortness of breath, abdominal pain, constipation, diarrhea, nausea, vomiting.  ED Course: Vital signs in the ED notable for blood pressure in the 120s to 140 systolic.  Lab workup included BMP with creatinine stable 1.13, glucose 23.  CBC with hemoglobin stable 11.5.  Imaging workup included chest x-ray which showed no acute abnormality but did show chronic scarring.  Left hand x-ray showed no acute normality, left wrist x-ray showed no acute abnormality, left hip x-ray showed no acute abnormality.  CT head showed acute left parafalcine subdural hemorrhage 3 mm in thickness.  CT maxillofacial showed no acute normality, CT C-spine showed no acute normality.  Patient received a liter of fluids and Tylenol in the ED.  Neurosurgery was consulted and recommended repeat CT scan in 12 hours from initial to monitor for progression of hemorrhage.  Also recommended holding/discontinuing Plavix.  Review of Systems: As per HPI otherwise all other systems reviewed and are negative.  Past Medical History:  Diagnosis Date   Acid reflux    High cholesterol    Hypertension    Stroke [facility])    Vertigo     Past Surgical History:  Procedure Laterality Date   ABDOMINAL HYSTERECTOMY      CHOLECYSTECTOMY     LEG SURGERY      Social History  reports that she has never smoked. She has never used smokeless tobacco. She reports that she does not drink alcohol and does not use drugs.  No Known Allergies  History reviewed. No pertinent family history.  Prior to Admission medications   Medication Sig Start Date End Date Taking? Authorizing Provider  acetaminophen (TYLENOL) 500 MG tablet Take 1,000 mg by mouth every 6 (six) hours as needed.   Yes [provider]  clopidogrel (PLAVIX) 75 MG tablet Take 75 mg by mouth at bedtime.   Yes [provider]  diclofenac Sodium (VOLTAREN ARTHRITIS PAIN) 1 % GEL Apply 2 g topically 4 (four) times daily as needed (joint pain).   Yes [provider]  lisinopril-hydrochlorothiazide (PRINZIDE,ZESTORETIC) 20-25 MG per tablet Take 1 tablet by mouth daily.   Yes [provider]  pantoprazole (PROTONIX) 40 MG tablet Take 40 mg by mouth daily.   Yes [provider]  sertraline (ZOLOFT) 25 MG tablet Take 25 mg by mouth daily.   Yes [provider]  simvastatin (ZOCOR) 40 MG tablet Take 40 mg by mouth every evening.   Yes [provider]  COVID-19 mRNA Vac-TriS, Pfizer, (PFIZER-BIONT COVID-19 VAC-TRIS) SUSP injection Inject into the muscle. Patient not taking: Reported on 04/12/2023 03/16/21   Judyann Munson, MD  Multiple Vitamins-Minerals (MULTIVITAMIN PO) Take 0.5 tablets by mouth daily. Patient not taking: Reported on 04/12/2023    [provider]    Physical Exam: Vitals:   04/12/23  1443 04/12/23 1446 04/12/23 1545 04/12/23 1600  BP:  (!) 148/72  126/72  Pulse:  75 71 72  Resp:  12 11 13   Temp:  (!) 97.5 F (36.4 C)    TempSrc:  Oral    SpO2:  99% 99% 100%  Weight: 66.7 kg     Height: 5\' 3"  (1.6 m)       Physical Exam Constitutional:      General: She is not in acute distress.    Appearance: Normal appearance.  HENT:     Head: Normocephalic and atraumatic.      Mouth/Throat:     Mouth: Mucous membranes are moist.     Pharynx: Oropharynx is clear.  Eyes:     Extraocular Movements: Extraocular movements intact.     Pupils: Pupils are equal, round, and reactive to light.  Cardiovascular:     Rate and Rhythm: Normal rate and regular rhythm.     Pulses: Normal pulses.     Heart sounds: Normal heart sounds.  Pulmonary:     Effort: Pulmonary effort is normal. No respiratory distress.     Breath sounds: Normal breath sounds.  Abdominal:     General: Bowel sounds are normal. There is no distension.     Palpations: Abdomen is soft.     Tenderness: There is no abdominal tenderness.  Musculoskeletal:        General: No swelling or deformity.  Skin:    General: Skin is warm and dry.  Neurological:     General: No focal deficit present.     Mental Status: Mental status is at baseline.     Labs on Admission: I have personally reviewed following labs and imaging studies  CBC: Recent Labs  Lab 04/12/23 1503  WBC 4.2  NEUTROABS 2.9  HGB 11.5*  HCT 34.6*  MCV 92.8  PLT 255    Basic Metabolic Panel: Recent Labs  Lab 04/12/23 1503  NA 137  K 4.3  CL 99  CO2 24  GLUCOSE 103*  BUN 12  CREATININE 1.13*  CALCIUM 9.6    GFR: Estimated Creatinine Clearance: 37 mL/min (A) (by C-G formula based on SCr of 1.13 mg/dL (H)).  Liver Function Tests: No results for input(s): "AST", "ALT", "ALKPHOS", "BILITOT", "PROT", "ALBUMIN" in the last 168 hours.  Urine analysis:    Component Value Date/Time   COLORURINE YELLOW 12/10/2014 1443   APPEARANCEUR CLEAR 12/10/2014 1443   LABSPEC 1.013 12/10/2014 1443   PHURINE 7.5 12/10/2014 1443   GLUCOSEU NEGATIVE 12/10/2014 1443   HGBUR NEGATIVE 12/10/2014 1443   BILIRUBINUR NEGATIVE 12/10/2014 1443   KETONESUR NEGATIVE 12/10/2014 1443   PROTEINUR NEGATIVE 12/10/2014 1443   UROBILINOGEN 0.2 12/10/2014 1443   NITRITE NEGATIVE 12/10/2014 1443   LEUKOCYTESUR TRACE (A) 12/10/2014 1443    Radiological  Exams on Admission: DG Hip Unilat W or Wo Pelvis 2-3 Views Left  Result Date: 04/12/2023 CLINICAL DATA:  Trauma, fall EXAM: DG HIP (WITH OR WITHOUT PELVIS) 2-3V LEFT COMPARISON:  None Available. FINDINGS: No recent fracture or dislocation is seen. Deformities are noted both pubic bones, possibly residual from previous injury. Inferior vena cava filter is seen. There is a surgical clip in the pelvis. IMPRESSION: No recent fracture or dislocation is seen left hip. Deformities in both pubic bones may be residual from previous injury. Electronically Signed   By: Ernie Avena M.D.   On: 04/12/2023 16:07   CT Head Wo Contrast  Result Date: 04/12/2023 CLINICAL DATA:  Status post  fall after feeling dizzy. EXAM: CT HEAD WITHOUT CONTRAST CT MAXILLOFACIAL WITHOUT CONTRAST CT CERVICAL SPINE WITHOUT CONTRAST TECHNIQUE: Multidetector CT imaging of the head, cervical spine, and maxillofacial structures were performed using the standard protocol without intravenous contrast. Multiplanar CT image reconstructions of the cervical spine and maxillofacial structures were also generated. RADIATION DOSE REDUCTION: This exam was performed according to the departmental dose-optimization program which includes automated exposure control, adjustment of the mA and/or kV according to patient size and/or use of iterative reconstruction technique. COMPARISON:  03/09/2022 FINDINGS: CT HEAD FINDINGS Brain: Acute left anterior parafalcine subdural hemorrhage measuring 3 mm in thickness. No evidence of acute infarction, ventriculomegaly, or mass effect. Generalized cerebral atrophy. Periventricular white matter low attenuation likely secondary to microangiopathy. Vascular: Cerebrovascular atherosclerotic calcifications are noted. Skull: Negative for fracture or focal lesion. Sinuses/Orbits: Visualized portions of the orbits are unremarkable. Visualized portions of the paranasal sinuses are unremarkable. Visualized portions of the mastoid  air cells are unremarkable. Other: None. CT MAXILLOFACIAL FINDINGS Osseous: No fracture or mandibular dislocation. No destructive process. Orbits: Negative. No traumatic or inflammatory finding. Sinuses: Clear. Soft tissues: Negative. CT CERVICAL SPINE FINDINGS Alignment: Anatomic alignment.  No static listhesis. Skull base and vertebrae: No acute fracture. No aggressive lytic or sclerotic osseous lesion. Soft tissues and spinal canal: Intraspinal soft tissues are not fully imaged on this examination due to poor soft tissue contrast, but there is no gross soft tissue abnormality. No prevertebral fluid or swelling. No visible canal hematoma. Disc levels: Degenerative disease with disc height loss at C4-5 and C5-6. Small central disc protrusion at C2-3. Mild broad-based disc bulge at C3-4. Mild broad-based disc osteophyte complex C4-5 with bilateral uncovertebral degenerative changes and mild right foraminal stenosis. At C5-6 there is a broad-based disc osteophyte complex, bilateral uncovertebral degenerative changes and mild right foraminal stenosis. Upper chest: Lung apices are clear. Other: No fluid collection or hematoma. IMPRESSION: 1. Acute left anterior parafalcine subdural hemorrhage measuring 3 mm in thickness. No midline shift. 2. No acute facial bone fracture. 3. No acute osseous injury of the cervical spine. Critical Value/emergent results were called by telephone at the time of interpretation on 04/12/2023 at 3:40 pm to provider [facility] , who verbally acknowledged these results. Electronically Signed   By: Elige Ko M.D.   On: 04/12/2023 15:43   CT Maxillofacial Wo Contrast  Result Date: 04/12/2023 CLINICAL DATA:  Status post fall after feeling dizzy. EXAM: CT HEAD WITHOUT CONTRAST CT MAXILLOFACIAL WITHOUT CONTRAST CT CERVICAL SPINE WITHOUT CONTRAST TECHNIQUE: Multidetector CT imaging of the head, cervical spine, and maxillofacial structures were performed using the standard protocol  without intravenous contrast. Multiplanar CT image reconstructions of the cervical spine and maxillofacial structures were also generated. RADIATION DOSE REDUCTION: This exam was performed according to the departmental dose-optimization program which includes automated exposure control, adjustment of the mA and/or kV according to patient size and/or use of iterative reconstruction technique. COMPARISON:  03/09/2022 FINDINGS: CT HEAD FINDINGS Brain: Acute left anterior parafalcine subdural hemorrhage measuring 3 mm in thickness. No evidence of acute infarction, ventriculomegaly, or mass effect. Generalized cerebral atrophy. Periventricular white matter low attenuation likely secondary to microangiopathy. Vascular: Cerebrovascular atherosclerotic calcifications are noted. Skull: Negative for fracture or focal lesion. Sinuses/Orbits: Visualized portions of the orbits are unremarkable. Visualized portions of the paranasal sinuses are unremarkable. Visualized portions of the mastoid air cells are unremarkable. Other: None. CT MAXILLOFACIAL FINDINGS Osseous: No fracture or mandibular dislocation. No destructive process. Orbits: Negative. No traumatic or inflammatory finding. Sinuses:  Clear. Soft tissues: Negative. CT CERVICAL SPINE FINDINGS Alignment: Anatomic alignment.  No static listhesis. Skull base and vertebrae: No acute fracture. No aggressive lytic or sclerotic osseous lesion. Soft tissues and spinal canal: Intraspinal soft tissues are not fully imaged on this examination due to poor soft tissue contrast, but there is no gross soft tissue abnormality. No prevertebral fluid or swelling. No visible canal hematoma. Disc levels: Degenerative disease with disc height loss at C4-5 and C5-6. Small central disc protrusion at C2-3. Mild broad-based disc bulge at C3-4. Mild broad-based disc osteophyte complex C4-5 with bilateral uncovertebral degenerative changes and mild right foraminal stenosis. At C5-6 there is a  broad-based disc osteophyte complex, bilateral uncovertebral degenerative changes and mild right foraminal stenosis. Upper chest: Lung apices are clear. Other: No fluid collection or hematoma. IMPRESSION: 1. Acute left anterior parafalcine subdural hemorrhage measuring 3 mm in thickness. No midline shift. 2. No acute facial bone fracture. 3. No acute osseous injury of the cervical spine. Critical Value/emergent results were called by telephone at the time of interpretation on 04/12/2023 at 3:40 pm to provider [facility] , who verbally acknowledged these results. Electronically Signed   By: Elige Ko M.D.   On: 04/12/2023 15:43   CT Cervical Spine Wo Contrast  Result Date: 04/12/2023 CLINICAL DATA:  Status post fall after feeling dizzy. EXAM: CT HEAD WITHOUT CONTRAST CT MAXILLOFACIAL WITHOUT CONTRAST CT CERVICAL SPINE WITHOUT CONTRAST TECHNIQUE: Multidetector CT imaging of the head, cervical spine, and maxillofacial structures were performed using the standard protocol without intravenous contrast. Multiplanar CT image reconstructions of the cervical spine and maxillofacial structures were also generated. RADIATION DOSE REDUCTION: This exam was performed according to the departmental dose-optimization program which includes automated exposure control, adjustment of the mA and/or kV according to patient size and/or use of iterative reconstruction technique. COMPARISON:  03/09/2022 FINDINGS: CT HEAD FINDINGS Brain: Acute left anterior parafalcine subdural hemorrhage measuring 3 mm in thickness. No evidence of acute infarction, ventriculomegaly, or mass effect. Generalized cerebral atrophy. Periventricular white matter low attenuation likely secondary to microangiopathy. Vascular: Cerebrovascular atherosclerotic calcifications are noted. Skull: Negative for fracture or focal lesion. Sinuses/Orbits: Visualized portions of the orbits are unremarkable. Visualized portions of the paranasal sinuses are  unremarkable. Visualized portions of the mastoid air cells are unremarkable. Other: None. CT MAXILLOFACIAL FINDINGS Osseous: No fracture or mandibular dislocation. No destructive process. Orbits: Negative. No traumatic or inflammatory finding. Sinuses: Clear. Soft tissues: Negative. CT CERVICAL SPINE FINDINGS Alignment: Anatomic alignment.  No static listhesis. Skull base and vertebrae: No acute fracture. No aggressive lytic or sclerotic osseous lesion. Soft tissues and spinal canal: Intraspinal soft tissues are not fully imaged on this examination due to poor soft tissue contrast, but there is no gross soft tissue abnormality. No prevertebral fluid or swelling. No visible canal hematoma. Disc levels: Degenerative disease with disc height loss at C4-5 and C5-6. Small central disc protrusion at C2-3. Mild broad-based disc bulge at C3-4. Mild broad-based disc osteophyte complex C4-5 with bilateral uncovertebral degenerative changes and mild right foraminal stenosis. At C5-6 there is a broad-based disc osteophyte complex, bilateral uncovertebral degenerative changes and mild right foraminal stenosis. Upper chest: Lung apices are clear. Other: No fluid collection or hematoma. IMPRESSION: 1. Acute left anterior parafalcine subdural hemorrhage measuring 3 mm in thickness. No midline shift. 2. No acute facial bone fracture. 3. No acute osseous injury of the cervical spine. Critical Value/emergent results were called by telephone at the time of interpretation on 04/12/2023 at 3:40 pm to  provider [facility] , who verbally acknowledged these results. Electronically Signed   By: Elige Ko M.D.   On: 04/12/2023 15:43   DG Wrist Complete Left  Result Date: 04/12/2023 CLINICAL DATA:  Dizziness, fall, pain. EXAM: LEFT WRIST - COMPLETE 3+ VIEW COMPARISON:  None Available. FINDINGS: Osseous alignment is normal. No fracture line or displaced fracture fragment. Mild degenerative change at the first [facility] joint. No large  osteophytes or other signs of advanced DJD. IMPRESSION: 1. No acute findings. No fracture or dislocation. 2. Mild degenerative change at the first [facility] joint. Electronically Signed   By: Bary Richard M.D.   On: 04/12/2023 15:32   DG Hand Complete Left  Result Date: 04/12/2023 CLINICAL DATA:  Dizziness, fall. EXAM: LEFT HAND - COMPLETE 3+ VIEW COMPARISON:  None Available. FINDINGS: No fracture line or displaced fracture fragment. Mild degenerative change at the first [facility] joint and majority of the IP joints. No large osteophytes or other signs of advanced DJD. Soft tissues about the LEFT hand are unremarkable. IMPRESSION: 1. No acute findings. No fracture or dislocation. 2. Mild degenerative change. Electronically Signed   By: Bary Richard M.D.   On: 04/12/2023 15:31   DG Chest Port 1 View  Result Date: 04/12/2023 CLINICAL DATA:  Dizziness, fall EXAM: PORTABLE CHEST 1 VIEW COMPARISON:  Chest x-ray dated 12/23/2018 FINDINGS: Heart size and mediastinal contours are stable. Chronic scarring/fibrosis of the LEFT lung. Lungs are otherwise clear. No pleural effusion or pneumothorax is seen. Old healed fractures of multiple LEFT-sided ribs. No acute-appearing osseous abnormality seen. IMPRESSION: 1. No active disease. No evidence of pneumonia or pulmonary edema. 2. Chronic scarring/fibrosis of the LEFT lung. Electronically Signed   By: Bary Richard M.D.   On: 04/12/2023 15:19    EKG: Independently reviewed.  Sinus rhythm at 74 bpm.  Nonspecific T wave flattening.  Assessment/Plan Principal Problem:   Subdural hematoma (HCC) Active Problems:   GERD (gastroesophageal reflux disease)   HTN (hypertension)   Hypercholesterolemia   Personal history of transient ischemic attack (TIA), and cerebral infarction without residual deficits   Fall Dizziness Subdural hematoma > Patient presenting after a fall at home, occurred following "dizziness "when walking to the kitchen and she fell onto her hardwood  floors. > Described as dizziness/of balance sensation. > Did hit her head, is on Plavix.  3 mm subdural parafalcine hematoma noted on CT. > Neurosurgery consulted and plan for repeat CT to monitor for progression and holding/discontinuing Plavix. - Monitoring telemetry - Appreciate neurosurgery recommendations - Repeat CT head tomorrow morning - Orthostatic vital signs - Vestibular PT  Hypertension - Continue lisinopril-hydrochlorothiazide  Hyperlipidemia - Continue home simvastatin  GERD - Continue home PPI  History of CVA - Holding home Plavix as above  DVT prophylaxis: SCDs Code Status:   Full  Family Communication:  Updated at bedside Disposition Plan:   Patient is from:  Home  Anticipated DC to:  Home  Anticipated DC date:  1 to 2 days  Anticipated DC barriers: None  Consults called:  Neurosurgery consulted in the ED Admission status:  Observation, telemetry  Severity of Illness: The appropriate patient status for this patient is OBSERVATION. Observation status is judged to be reasonable and necessary in order to provide the required intensity of service to ensure the patient's safety. The patient's presenting symptoms, physical exam findings, and initial radiographic and laboratory data in the context of their medical condition is felt to place them at decreased risk for further clinical deterioration. Furthermore,  it is anticipated that the patient will be medically stable for discharge from the hospital within 2 midnights of admission.    Synetta Fail MD [facility]  How to contact the [facility] Attending or Consulting provider 7A - 7P or covering provider during after hours 7P -7A, for this patient?   Check the care team in [facility] and look for a) attending/consulting [facility] provider listed and b) the [facility] team listed Log into [email or web address] and use [place]'s universal password to access. If you do not have the password, please contact the hospital  operator. Locate the [facility] provider you are looking for under [facility] and page to a number that you can be directly reached. If you still have difficulty reaching the provider, please page the [facility] (Director on Call) for the Hospitalists listed on amion for assistance.  04/12/2023, 4:30 PM

## 2023-04-12 NOTE — Progress Notes (Signed)
Called by ED for this 81yo patient s/p fall on plavix for previous stroke. Neurologically at baseline c/o HA. CT personally reviewed and demonstrates minuscule anterior falcine SDH, no local mass effect or MLS. This does not warrant any specific intervention. Can stop plavix, repeat CTH in am and monitor exam, Can restart plavix in 1 week if she remains stable.  Lisbeth Renshaw, MD [facility]

## 2023-04-12 NOTE — ED Notes (Signed)
Pt reports walking in her kitchen today when she felt dizzy and fell. Landed onto [employer]. Pt reports hitting her head and fell on her left side. No LOC. No obvious deformity. Takes Plavix due to hx of stroke. Pt is alert and oriented x 4. Ambulatory.

## 2023-04-13 ENCOUNTER — Observation Stay (HOSPITAL_COMMUNITY): Payer: 59

## 2023-04-13 DIAGNOSIS — Y92009 Unspecified place in unspecified non-institutional (private) residence as the place of occurrence of the external cause: Secondary | ICD-10-CM

## 2023-04-13 DIAGNOSIS — I10 Essential (primary) hypertension: Secondary | ICD-10-CM | POA: Diagnosis not present

## 2023-04-13 DIAGNOSIS — W19XXXA Unspecified fall, initial encounter: Secondary | ICD-10-CM | POA: Diagnosis not present

## 2023-04-13 DIAGNOSIS — S065XAA Traumatic subdural hemorrhage with loss of consciousness status unknown, initial encounter: Secondary | ICD-10-CM | POA: Diagnosis not present

## 2023-04-13 DIAGNOSIS — E7800 Pure hypercholesterolemia, unspecified: Secondary | ICD-10-CM | POA: Diagnosis not present

## 2023-04-13 LAB — CBC
HCT: 32.4 % — ABNORMAL LOW (ref 36.0–46.0)
Hemoglobin: 10.8 g/dL — ABNORMAL LOW (ref 12.0–15.0)
MCH: 30.3 pg (ref 26.0–34.0)
MCHC: 33.3 g/dL (ref 30.0–36.0)
MCV: 91 fL (ref 80.0–100.0)
Platelets: 233 10*3/uL (ref 150–400)
RBC: 3.56 MIL/uL — ABNORMAL LOW (ref 3.87–5.11)
RDW: 12 % (ref 11.5–15.5)
WBC: 4.1 10*3/uL (ref 4.0–10.5)
nRBC: 0 % (ref 0.0–0.2)

## 2023-04-13 LAB — COMPREHENSIVE METABOLIC PANEL
ALT: 11 U/L (ref 0–44)
AST: 16 U/L (ref 15–41)
Albumin: 3.5 g/dL (ref 3.5–5.0)
Alkaline Phosphatase: 58 U/L (ref 38–126)
Anion gap: 7 (ref 5–15)
BUN: 10 mg/dL (ref 8–23)
CO2: 28 mmol/L (ref 22–32)
Calcium: 9.3 mg/dL (ref 8.9–10.3)
Chloride: 102 mmol/L (ref 98–111)
Creatinine, Ser: 1.11 mg/dL — ABNORMAL HIGH (ref 0.44–1.00)
GFR, Estimated: 51 mL/min — ABNORMAL LOW (ref >60)
Glucose, Bld: 95 mg/dL (ref 70–99)
Potassium: 3.3 mmol/L — ABNORMAL LOW (ref 3.5–5.1)
Sodium: 137 mmol/L (ref 135–145)
Total Bilirubin: 0.8 mg/dL (ref 0.3–1.2)
Total Protein: 6.2 g/dL — ABNORMAL LOW (ref 6.5–8.1)

## 2023-04-13 LAB — GLUCOSE, CAPILLARY: Glucose-Capillary: 96 mg/dL (ref 70–99)

## 2023-04-13 LAB — MAGNESIUM: Magnesium: 1.7 mg/dL (ref 1.7–2.4)

## 2023-04-13 MED ORDER — CLOPIDOGREL BISULFATE 75 MG PO TABS: 75.0000 mg | ORAL_TABLET | Freq: Every day | ORAL | Status: DC

## 2023-04-13 MED ORDER — POTASSIUM CHLORIDE CRYS ER 20 MEQ PO TBCR
40.0000 meq | EXTENDED_RELEASE_TABLET | ORAL | Status: DC
  Administered 2023-04-13: 40 meq via ORAL
  Filled 2023-04-13: qty 2

## 2023-04-13 MED ORDER — ONDANSETRON HCL 4 MG PO TABS: 4.0000 mg | ORAL_TABLET | Freq: Every day | ORAL | 0 refills | Status: DC | PRN

## 2023-04-13 NOTE — Evaluation (Signed)
Physical Therapy Evaluation and Discharge Patient Details Name: Chelsey Haas MRN: [id number] DOB: 1941-08-22 Today's Date: 04/13/2023  History of Present Illness  82 y.o. female presenting 7/4 after a fall at home. Past medical history significant of hypertension, hyperlipidemia, stroke, GERD.  Clinical Impression  Patient evaluated by Physical Therapy with no further acute PT needs identified. All education has been completed and the patient has no further questions. Standardized testing shows low fall risk (BERG, DGI.)  LE strength symmetrical, coordination with Heel-to-Shin WNL. Pt became nauseous while ambulating and vomited (RN and MD notified.) Felt much better afterwards. Ambulates without loss of balance, no assistive device. Recommend HHPT follow-up for assessment of home environment due to multiple falls reported at home. See below for any follow-up Physical Therapy or equipment needs. PT is signing off. Thank you for this referral.    04/13/23 0900  Vital Signs  BP 136/60  BP Method Automatic  Patient Position (if appropriate) Lying  Orthostatic Lying   BP- Lying 135/60  Pulse- Lying 63  Orthostatic Sitting  BP- Sitting 139/73  Pulse- Sitting 71  Orthostatic Standing at 0 minutes  BP- Standing at 0 minutes 125/66 (asymptomatic)  Pulse- Standing at 0 minutes 85  Orthostatic Standing at 3 minutes  BP- Standing at 3 minutes 122/71  Pulse- Standing at 3 minutes 93    No apparent vestibular recognized at this time (see details below.)        04/13/23 0001  Vestibular Assessment  General Observation In bed, no apparent distress  Symptom Behavior  Subjective history of current problem States she began to stumble when walking about 3 weeks ago, however reports falling prior to this from stumbling. Denies any subjective dizziness.  Type of Dizziness  Imbalance  Frequency of Dizziness Stumbles every other day  Duration of Dizziness stumbles  Symptom Nature  (walking)   Aggravating Factors  (When walking only)  Relieving Factors Not applicable  Progression of Symptoms Worse  History of similar episodes One previous fall about 1 month ago  Oculomotor Exam  Oculomotor Alignment Normal  Ocular ROM wfl  Spontaneous Absent  Gaze-induced   (End range WFL)  Smooth Pursuits Saccades  Saccades Poor trajectory  Auditory  Comments denies any changes recently  Positional Testing  Dix-Hallpike Dix-Hallpike Right  Sidelying Test Sidelying Right;Sidelying Left  Horizontal Canal Testing Horizontal Canal Right;Horizontal Canal Left  Dix-Hallpike Right  Dix-Hallpike Right Duration neg  Sidelying Right  Sidelying Right Duration neg  Sidelying Left  Sidelying Left Duration neg  Horizontal Canal Right  Horizontal Canal Right Duration neg  Horizontal Canal Left  Horizontal Canal Left Duration neg  Cognition  Cognition Orientation Level Oriented x 4  Positional Sensitivities  Up from Left Hallpike 0  Rolling Right 0  Rolling Left 0     Assistance Recommended at Discharge PRN  If plan is discharge home, recommend the following:  Can travel by private vehicle  Assist for transportation        Equipment Recommendations None recommended by PT     Functional Status Assessment Patient has had a recent decline in their functional status and demonstrates the ability to make significant improvements in function in a reasonable and predictable amount of time.     Precautions / Restrictions Precautions Precautions: Fall Restrictions Weight Bearing Restrictions: No      Mobility  Bed Mobility Overal bed mobility: Modified Independent             General bed mobility comments: no assist, extra  time    Transfers Overall transfer level: Modified independent Equipment used: None               General transfer comment: Stable upon rising, slow transition, performed from bed and toilet without assist.    Ambulation/Gait Ambulation/Gait  assistance: Modified independent (Device/Increase time) Gait Distance (Feet): 150 Feet Assistive device: None Gait Pattern/deviations: Step-through pattern Gait velocity: dec Gait velocity interpretation: 1.31 - 2.62 ft/sec, indicative of limited community ambulator   General Gait Details: Grossly WFL with intermittent Rt foot showing early heel strike. No overt LOB with dynamic challenges.  Stairs            Wheelchair Mobility     Tilt Bed    Modified Rankin (Stroke Patients Only) Modified Rankin (Stroke Patients Only) Pre-Morbid Rankin Score: No symptoms Modified Rankin: Slight disability     Balance Overall balance assessment: Modified Independent                               Standardized Balance Assessment Standardized Balance Assessment : Dynamic Gait Index, Berg Balance Test Berg Balance Test Sit to Stand: Able to stand  independently using hands Standing Unsupported: Able to stand safely 2 minutes Sitting with Back Unsupported but Feet Supported on Floor or Stool: Able to sit safely and securely 2 minutes Stand to Sit: Sits safely with minimal use of hands Transfers: Able to transfer safely, definite need of hands Standing Unsupported with Eyes Closed: Able to stand 10 seconds safely Standing Ubsupported with Feet Together: Able to place feet together independently and stand 1 minute safely From Standing, Reach Forward with Outstretched Arm: Can reach confidently >25 cm (10") From Standing Position, Pick up Object from Floor: Able to pick up shoe safely and easily From Standing Position, Turn to Look Behind Over each Shoulder: Looks behind from both sides and weight shifts well Turn 360 Degrees: Able to turn 360 degrees safely in 4 seconds or less Standing Unsupported, Alternately Place Feet on Step/Stool: Able to stand independently and complete 8 steps >20 seconds Standing Unsupported, One Foot in Front: Able to plae foot ahead of the other  independently and hold 30 seconds Standing on One Leg: Able to lift leg independently and hold 5-10 seconds Total Score: 51 Dynamic Gait Index Level Surface: Normal Change in Gait Speed: Normal Gait with Horizontal Head Turns: Normal Gait with Vertical Head Turns: Normal Gait and Pivot Turn: Normal Step Over Obstacle: Mild Impairment Step Around Obstacles: Mild Impairment Steps: Mild Impairment Total Score: 21       Pertinent Vitals/Pain Pain Assessment Pain Assessment: No/denies pain    Home Living Family/patient expects to be discharged to:: Private residence Living Arrangements: Spouse/significant other Available Help at Discharge: Family;Available 24 hours/day Type of Home: House Home Access: Stairs to enter Entrance Stairs-Rails: Lawyer of Steps: 3   Home Layout: One level Home Equipment: Toilet riser;Tub bench      Prior Function Prior Level of Function : Independent/Modified Independent;Driving;History of Falls (last six months)             Mobility Comments: ind but reports several falls ADLs Comments: drives, helps husband.     Hand Dominance   Dominant Hand: Right    Extremity/Trunk Assessment   Upper Extremity Assessment Upper Extremity Assessment: Defer to OT evaluation    Lower Extremity Assessment Lower Extremity Assessment: Overall WFL for tasks assessed (no focal deficits, heel to shin test  WNL)       Communication   Communication: No difficulties  Cognition Arousal/Alertness: Awake/alert Behavior During Therapy: WFL for tasks assessed/performed Overall Cognitive Status: Within Functional Limits for tasks assessed                                          General Comments General comments (skin integrity, edema, etc.): See orthostatics; pt became nauseous while ambulating in hallway, vomited when returned to room; States she feels much better aftry lying down again; BP without drop. MD and  RN notified.    Exercises     Assessment/Plan    PT Assessment Patient does not need any further PT services  PT Problem List         PT Treatment Interventions      PT Goals (Current goals can be found in the Care Plan section)  Acute Rehab PT Goals Patient Stated Goal: go home PT Goal Formulation: All assessment and education complete, DC therapy    Frequency       Co-evaluation               AM-PAC PT "6 Clicks" Mobility  Outcome Measure Help needed turning from your back to your side while in a flat bed without using bedrails?: None Help needed moving from lying on your back to sitting on the side of a flat bed without using bedrails?: None Help needed moving to and from a bed to a chair (including a wheelchair)?: None Help needed standing up from a chair using your arms (e.g., wheelchair or bedside chair)?: None Help needed to walk in hospital room?: None Help needed climbing 3-5 steps with a railing? : A Little 6 Click Score: 23    End of Session   Activity Tolerance: Patient tolerated treatment well Patient left: in bed;with call bell/phone within reach;with bed alarm set Nurse Communication: Mobility status PT Visit Diagnosis: Other abnormalities of gait and mobility (R26.89);History of falling (Z91.81)    Time: 6295-2841 PT Time Calculation (min) (ACUTE ONLY): 62 min   Charges:   PT Evaluation $PT Eval Low Complexity: 1 Low PT Treatments $Gait Training: 8-22 mins $Therapeutic Activity: 23-37 mins PT General Charges $$ ACUTE PT VISIT: 1 Visit         Kathlyn Sacramento, PT, DPT [facility]  Rehabilitation Services Physical Therapist Office: [phone] Website: [email or web address]   Berton Mount 04/13/2023, 10:12 AM

## 2023-04-13 NOTE — Discharge Summary (Signed)
Physician Discharge Summary  Chelsey Haas ZOX:[id number] DOB: Jan 22, 1941 DOA: 04/12/2023  PCP: Pcp, No  Admit date: 04/12/2023 Discharge date: 04/13/2023 Admitted From: Home Disposition: Home Recommendations for Outpatient Follow-up:  Follow up with PCP in 1 week Outpatient follow-up with neurology as soon as possible Check BP, CMP and CBC at follow-up Please follow up on the following pending results: None  Home Health: [facility] PT/OT Equipment/Devices: None  Discharge Condition: Stable CODE STATUS: Full code  Follow-up Information     [facility] Follow up.   Why: The home health agency will contact you for the first home visit. 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  Per H&P, patient felt dizzy walking to the kitchen and fell and landed on hard floor.  No prodromes.  On further interview, patient denies dizziness.  She reports fall on hardwood floor after she stumbled due to some residual right leg weakness since she had a stroke.  CT head showed 3 mm acute left parafalcine subdural hematoma hemorrhage.  Chest, left hand, left wrist and left hip x-ray without acute finding.    Basic labs without significant finding.  Serial troponin negative.  EKG without acute finding.  No events on telemetry.  Neurosurgery consulted and recommended repeat CT in 12 hours to monitor subdural hematoma.  The next day, CT head obtained and showed stable 3 mm left parafalcine subdural hemorrhage.  Patient remained stable.  Complete neuroexam without significant finding other than subtle residual aphasia.  She was evaluated by PT/OT and discharged home with home health PT/OT.  Neurosurgery recommended holding Plavix for 1 week.  Encouraged to follow-up with her PCP and neurologist.  Hypokalemia replenished prior to  discharge.  See individual problem list below for more.   Problems addressed during this hospitalization Principal Problem:   Subdural hematoma (HCC) Active Problems:   GERD (gastroesophageal reflux disease)   HTN (hypertension)   Hypercholesterolemia   Personal history of transient ischemic attack (TIA), and cerebral infarction without residual deficits              Time spent 35 minutes  Vital signs Vitals:   04/13/23 0500 04/13/23 0800 04/13/23 0900 04/13/23 1325  BP:   136/60 119/66  Pulse:    70  Temp:  98.6 F (37 C)  98.3 F (36.8 C)  Resp:    16  Height:      Weight: 67 kg     SpO2:  100%  100%  TempSrc:  Oral  Oral  BMI (Calculated): 26.17        Discharge exam  GENERAL: No apparent distress.  Nontoxic. HEENT: MMM.  Vision and hearing grossly intact.  NECK: Supple.  No apparent JVD.  RESP:  No IWOB.  Fair aeration bilaterally. CVS:  RRR. Heart sounds normal.  ABD/GI/GU: BS+. Abd soft, NTND.  MSK/EXT:  Moves extremities. No apparent deformity. No edema.  SKIN: no apparent skin lesion or wound NEURO: Awake, alert and oriented appropriately. Speech clear. Cranial nerves II-XII  intact except for subtle expressive aphasia. Motor 5/5 in all muscle groups of UE and LE bilaterally, Normal tone. Light sensation intact in all dermatomes of upper and lower ext bilaterally. Patellar reflex symmetric.  No pronator drift.  Finger to nose intact. PSYCH: Calm. Normal affect.  None  Discharge Instructions Discharge Instructions     Diet - low sodium heart healthy   Complete by: As directed    Discharge instructions   Complete by: As directed    Discharge instructions   Complete by: As directed    It has been a pleasure taking care of you!  You were hospitalized due to fall and small bleeding in your head.  We recommend holding your Plavix for 1 week.  Follow-up with your primary care doctor in 1 to 2 weeks or sooner if needed.   Take care,   Increase  activity slowly   Complete by: As directed       Allergies as of 04/13/2023   No Known Allergies      Medication List     TAKE these medications    acetaminophen 500 MG tablet Commonly known as: TYLENOL Take 1,000 mg by mouth every 6 (six) hours as needed.   clopidogrel 75 MG tablet Commonly known as: PLAVIX Take 1 tablet (75 mg total) by mouth at bedtime. Start taking on: April 20, 2023 What changed: These instructions start on April 20, 2023. If you are unsure what to do until then, ask your doctor or other care provider.   lisinopril-hydrochlorothiazide 20-25 MG tablet Commonly known as: ZESTORETIC Take 1 tablet by mouth at bedtime.   MULTIVITAMIN PO Take 0.5 tablets by mouth daily.   ondansetron 4 MG tablet Commonly known as: Zofran Take 1 tablet (4 mg total) by mouth daily as needed for up to 365 doses for nausea or vomiting.   pantoprazole 40 MG tablet Commonly known as: PROTONIX Take 40 mg by mouth at bedtime.   Pfizer-BioNT COVID-19 Vac-TriS Susp injection Generic drug: COVID-19 mRNA Vac-TriS (Pfizer) Inject into the muscle.   sertraline 25 MG tablet Commonly known as: ZOLOFT Take 25 mg by mouth at bedtime.   simvastatin 40 MG tablet Commonly known as: ZOCOR Take 40 mg by mouth at bedtime.   Voltaren Arthritis Pain 1 % Gel Generic drug: diclofenac Sodium Apply 2 g topically 4 (four) times daily as needed (joint pain).        Consultations: Neurosurgery  Procedures/Studies:   CT HEAD WO CONTRAST ( )  Result Date: 04/13/2023 CLINICAL DATA:  Follow-up subdural hematoma. EXAM: CT HEAD WITHOUT CONTRAST TECHNIQUE: Contiguous axial images were obtained from the base of the skull through the vertex without intravenous contrast. RADIATION DOSE REDUCTION: This exam was performed according to the departmental dose-optimization program which includes automated exposure control, adjustment of the mA and/or kV according to patient size and/or use of  iterative reconstruction technique. COMPARISON:  Yesterday FINDINGS: Brain: Left parafalcine subdural hematoma towards the vertex measuring 3 mm, unchanged. Chronic left MCA branch infarct centered on the insula and adjacent perisylvian cortex. Small chronic right cerebellar infarct. No evidence of progressive hemorrhage, mass, hydrocephalus, or shift. Vascular: No hyperdense vessel or unexpected calcification. Skull: No acute fracture. Sinuses/Orbits: No visible injury IMPRESSION: No progression of a 3 mm thick left parafalcine subdural hematoma. No new abnormality. Electronically Signed   By: Tiburcio Pea M.D.   On: 04/13/2023 06:24   DG Hip Unilat W or Wo Pelvis 2-3 Views Left  Result Date: 04/12/2023 CLINICAL DATA:  Trauma, fall EXAM: DG HIP (WITH OR WITHOUT PELVIS) 2-3V LEFT COMPARISON:  None Available. FINDINGS: No recent fracture or dislocation is seen. Deformities are noted both pubic bones, possibly residual from previous injury. Inferior vena cava filter is seen. There is a surgical clip in the  pelvis. IMPRESSION: No recent fracture or dislocation is seen left hip. Deformities in both pubic bones may be residual from previous injury. Electronically Signed   By: Ernie Avena M.D.   On: 04/12/2023 16:07   CT Head Wo Contrast  Result Date: 04/12/2023 CLINICAL DATA:  Status post fall after feeling dizzy. EXAM: CT HEAD WITHOUT CONTRAST CT MAXILLOFACIAL WITHOUT CONTRAST CT CERVICAL SPINE WITHOUT CONTRAST TECHNIQUE: Multidetector CT imaging of the head, cervical spine, and maxillofacial structures were performed using the standard protocol without intravenous contrast. Multiplanar CT image reconstructions of the cervical spine and maxillofacial structures were also generated. RADIATION DOSE REDUCTION: This exam was performed according to the departmental dose-optimization program which includes automated exposure control, adjustment of the mA and/or kV according to patient size and/or use of  iterative reconstruction technique. COMPARISON:  03/09/2022 FINDINGS: CT HEAD FINDINGS Brain: Acute left anterior parafalcine subdural hemorrhage measuring 3 mm in thickness. No evidence of acute infarction, ventriculomegaly, or mass effect. Generalized cerebral atrophy. Periventricular white matter low attenuation likely secondary to microangiopathy. Vascular: Cerebrovascular atherosclerotic calcifications are noted. Skull: Negative for fracture or focal lesion. Sinuses/Orbits: Visualized portions of the orbits are unremarkable. Visualized portions of the paranasal sinuses are unremarkable. Visualized portions of the mastoid air cells are unremarkable. Other: None. CT MAXILLOFACIAL FINDINGS Osseous: No fracture or mandibular dislocation. No destructive process. Orbits: Negative. No traumatic or inflammatory finding. Sinuses: Clear. Soft tissues: Negative. CT CERVICAL SPINE FINDINGS Alignment: Anatomic alignment.  No static listhesis. Skull base and vertebrae: No acute fracture. No aggressive lytic or sclerotic osseous lesion. Soft tissues and spinal canal: Intraspinal soft tissues are not fully imaged on this examination due to poor soft tissue contrast, but there is no gross soft tissue abnormality. No prevertebral fluid or swelling. No visible canal hematoma. Disc levels: Degenerative disease with disc height loss at C4-5 and C5-6. Small central disc protrusion at C2-3. Mild broad-based disc bulge at C3-4. Mild broad-based disc osteophyte complex C4-5 with bilateral uncovertebral degenerative changes and mild right foraminal stenosis. At C5-6 there is a broad-based disc osteophyte complex, bilateral uncovertebral degenerative changes and mild right foraminal stenosis. Upper chest: Lung apices are clear. Other: No fluid collection or hematoma. IMPRESSION: 1. Acute left anterior parafalcine subdural hemorrhage measuring 3 mm in thickness. No midline shift. 2. No acute facial bone fracture. 3. No acute osseous  injury of the cervical spine. Critical Value/emergent results were called by telephone at the time of interpretation on 04/12/2023 at 3:40 pm to provider [facility] , who verbally acknowledged these results. Electronically Signed   By: Elige Ko M.D.   On: 04/12/2023 15:43   CT Maxillofacial Wo Contrast  Result Date: 04/12/2023 CLINICAL DATA:  Status post fall after feeling dizzy. EXAM: CT HEAD WITHOUT CONTRAST CT MAXILLOFACIAL WITHOUT CONTRAST CT CERVICAL SPINE WITHOUT CONTRAST TECHNIQUE: Multidetector CT imaging of the head, cervical spine, and maxillofacial structures were performed using the standard protocol without intravenous contrast. Multiplanar CT image reconstructions of the cervical spine and maxillofacial structures were also generated. RADIATION DOSE REDUCTION: This exam was performed according to the departmental dose-optimization program which includes automated exposure control, adjustment of the mA and/or kV according to patient size and/or use of iterative reconstruction technique. COMPARISON:  03/09/2022 FINDINGS: CT HEAD FINDINGS Brain: Acute left anterior parafalcine subdural hemorrhage measuring 3 mm in thickness. No evidence of acute infarction, ventriculomegaly, or mass effect. Generalized cerebral atrophy. Periventricular white matter low attenuation likely secondary to microangiopathy. Vascular: Cerebrovascular atherosclerotic calcifications are noted. Skull: Negative for  fracture or focal lesion. Sinuses/Orbits: Visualized portions of the orbits are unremarkable. Visualized portions of the paranasal sinuses are unremarkable. Visualized portions of the mastoid air cells are unremarkable. Other: None. CT MAXILLOFACIAL FINDINGS Osseous: No fracture or mandibular dislocation. No destructive process. Orbits: Negative. No traumatic or inflammatory finding. Sinuses: Clear. Soft tissues: Negative. CT CERVICAL SPINE FINDINGS Alignment: Anatomic alignment.  No static listhesis. Skull  base and vertebrae: No acute fracture. No aggressive lytic or sclerotic osseous lesion. Soft tissues and spinal canal: Intraspinal soft tissues are not fully imaged on this examination due to poor soft tissue contrast, but there is no gross soft tissue abnormality. No prevertebral fluid or swelling. No visible canal hematoma. Disc levels: Degenerative disease with disc height loss at C4-5 and C5-6. Small central disc protrusion at C2-3. Mild broad-based disc bulge at C3-4. Mild broad-based disc osteophyte complex C4-5 with bilateral uncovertebral degenerative changes and mild right foraminal stenosis. At C5-6 there is a broad-based disc osteophyte complex, bilateral uncovertebral degenerative changes and mild right foraminal stenosis. Upper chest: Lung apices are clear. Other: No fluid collection or hematoma. IMPRESSION: 1. Acute left anterior parafalcine subdural hemorrhage measuring 3 mm in thickness. No midline shift. 2. No acute facial bone fracture. 3. No acute osseous injury of the cervical spine. Critical Value/emergent results were called by telephone at the time of interpretation on 04/12/2023 at 3:40 pm to provider [facility] , who verbally acknowledged these results. Electronically Signed   By: Elige Ko M.D.   On: 04/12/2023 15:43   CT Cervical Spine Wo Contrast  Result Date: 04/12/2023 CLINICAL DATA:  Status post fall after feeling dizzy. EXAM: CT HEAD WITHOUT CONTRAST CT MAXILLOFACIAL WITHOUT CONTRAST CT CERVICAL SPINE WITHOUT CONTRAST TECHNIQUE: Multidetector CT imaging of the head, cervical spine, and maxillofacial structures were performed using the standard protocol without intravenous contrast. Multiplanar CT image reconstructions of the cervical spine and maxillofacial structures were also generated. RADIATION DOSE REDUCTION: This exam was performed according to the departmental dose-optimization program which includes automated exposure control, adjustment of the mA and/or kV  according to patient size and/or use of iterative reconstruction technique. COMPARISON:  03/09/2022 FINDINGS: CT HEAD FINDINGS Brain: Acute left anterior parafalcine subdural hemorrhage measuring 3 mm in thickness. No evidence of acute infarction, ventriculomegaly, or mass effect. Generalized cerebral atrophy. Periventricular white matter low attenuation likely secondary to microangiopathy. Vascular: Cerebrovascular atherosclerotic calcifications are noted. Skull: Negative for fracture or focal lesion. Sinuses/Orbits: Visualized portions of the orbits are unremarkable. Visualized portions of the paranasal sinuses are unremarkable. Visualized portions of the mastoid air cells are unremarkable. Other: None. CT MAXILLOFACIAL FINDINGS Osseous: No fracture or mandibular dislocation. No destructive process. Orbits: Negative. No traumatic or inflammatory finding. Sinuses: Clear. Soft tissues: Negative. CT CERVICAL SPINE FINDINGS Alignment: Anatomic alignment.  No static listhesis. Skull base and vertebrae: No acute fracture. No aggressive lytic or sclerotic osseous lesion. Soft tissues and spinal canal: Intraspinal soft tissues are not fully imaged on this examination due to poor soft tissue contrast, but there is no gross soft tissue abnormality. No prevertebral fluid or swelling. No visible canal hematoma. Disc levels: Degenerative disease with disc height loss at C4-5 and C5-6. Small central disc protrusion at C2-3. Mild broad-based disc bulge at C3-4. Mild broad-based disc osteophyte complex C4-5 with bilateral uncovertebral degenerative changes and mild right foraminal stenosis. At C5-6 there is a broad-based disc osteophyte complex, bilateral uncovertebral degenerative changes and mild right foraminal stenosis. Upper chest: Lung apices are clear. Other: No fluid collection  or hematoma. IMPRESSION: 1. Acute left anterior parafalcine subdural hemorrhage measuring 3 mm in thickness. No midline shift. 2. No acute facial  bone fracture. 3. No acute osseous injury of the cervical spine. Critical Value/emergent results were called by telephone at the time of interpretation on 04/12/2023 at 3:40 pm to provider [facility] , who verbally acknowledged these results. Electronically Signed   By: Elige Ko M.D.   On: 04/12/2023 15:43   DG Wrist Complete Left  Result Date: 04/12/2023 CLINICAL DATA:  Dizziness, fall, pain. EXAM: LEFT WRIST - COMPLETE 3+ VIEW COMPARISON:  None Available. FINDINGS: Osseous alignment is normal. No fracture line or displaced fracture fragment. Mild degenerative change at the first [facility] joint. No large osteophytes or other signs of advanced DJD. IMPRESSION: 1. No acute findings. No fracture or dislocation. 2. Mild degenerative change at the first [facility] joint. Electronically Signed   By: Bary Richard M.D.   On: 04/12/2023 15:32   DG Hand Complete Left  Result Date: 04/12/2023 CLINICAL DATA:  Dizziness, fall. EXAM: LEFT HAND - COMPLETE 3+ VIEW COMPARISON:  None Available. FINDINGS: No fracture line or displaced fracture fragment. Mild degenerative change at the first [facility] joint and majority of the IP joints. No large osteophytes or other signs of advanced DJD. Soft tissues about the LEFT hand are unremarkable. IMPRESSION: 1. No acute findings. No fracture or dislocation. 2. Mild degenerative change. Electronically Signed   By: Bary Richard M.D.   On: 04/12/2023 15:31   DG Chest Port 1 View  Result Date: 04/12/2023 CLINICAL DATA:  Dizziness, fall EXAM: PORTABLE CHEST 1 VIEW COMPARISON:  Chest x-ray dated 12/23/2018 FINDINGS: Heart size and mediastinal contours are stable. Chronic scarring/fibrosis of the LEFT lung. Lungs are otherwise clear. No pleural effusion or pneumothorax is seen. Old healed fractures of multiple LEFT-sided ribs. No acute-appearing osseous abnormality seen. IMPRESSION: 1. No active disease. No evidence of pneumonia or pulmonary edema. 2. Chronic scarring/fibrosis of the LEFT lung.  Electronically Signed   By: Bary Richard M.D.   On: 04/12/2023 15:19       The results of significant diagnostics from this hospitalization (including imaging, microbiology, ancillary and laboratory) are listed below for reference.     Microbiology: No results found for this or any previous visit (from the past 240 hour(s)).   Labs:  CBC: Recent Labs  Lab 04/12/23 1503 04/13/23 0541  WBC 4.2 4.1  NEUTROABS 2.9  --   HGB 11.5* 10.8*  HCT 34.6* 32.4*  MCV 92.8 91.0  PLT 255 233   BMP &GFR Recent Labs  Lab 04/12/23 1503 04/13/23 0541  NA 137 137  K 4.3 3.3*  CL 99 102  CO2 24 28  GLUCOSE 103* 95  BUN 12 10  CREATININE 1.13* 1.11*  CALCIUM 9.6 9.3  MG  --  1.7   Estimated Creatinine Clearance: 37.8 mL/min (A) (by C-G formula based on SCr of 1.11 mg/dL (H)). Liver & Pancreas: Recent Labs  Lab 04/13/23 0541  AST 16  ALT 11  ALKPHOS 58  BILITOT 0.8  PROT 6.2*  ALBUMIN 3.5   No results for input(s): "LIPASE", "AMYLASE" in the last 168 hours. No results for input(s): "AMMONIA" in the last 168 hours. Diabetic: No results for input(s): "HGBA1C" in the last 72 hours. Recent Labs  Lab 04/13/23 0549  GLUCAP 96   Cardiac Enzymes: No results for input(s): "CKTOTAL", "CKMB", "CKMBINDEX", "TROPONINI" in the last 168 hours. No results for input(s): "PROBNP" in the last 8760  hours. Coagulation Profile: No results for input(s): "INR", "PROTIME" in the last 168 hours. Thyroid Function Tests: No results for input(s): "TSH", "T4TOTAL", "FREET4", "T3FREE", "THYROIDAB" in the last 72 hours. Lipid Profile: No results for input(s): "CHOL", "HDL", "LDLCALC", "TRIG", "CHOLHDL", "LDLDIRECT" in the last 72 hours. Anemia Panel: No results for input(s): "VITAMINB12", "FOLATE", "FERRITIN", "TIBC", "IRON", "RETICCTPCT" in the last 72 hours. Urine analysis:    Component Value Date/Time   COLORURINE YELLOW 12/10/2014 1443   APPEARANCEUR CLEAR 12/10/2014 1443   LABSPEC 1.013  12/10/2014 1443   PHURINE 7.5 12/10/2014 1443   GLUCOSEU NEGATIVE 12/10/2014 1443   HGBUR NEGATIVE 12/10/2014 1443   BILIRUBINUR NEGATIVE 12/10/2014 1443   KETONESUR NEGATIVE 12/10/2014 1443   PROTEINUR NEGATIVE 12/10/2014 1443   UROBILINOGEN 0.2 12/10/2014 1443   NITRITE NEGATIVE 12/10/2014 1443   LEUKOCYTESUR TRACE (A) 12/10/2014 1443   Sepsis Labs: Invalid input(s): "PROCALCITONIN", "LACTICIDVEN"   SIGNED:  Almon Hercules, MD  [facility] 04/13/2023, 8:39 PM

## 2023-04-13 NOTE — TOC Transition Note (Signed)
Transition of Care [facility]) - CM/SW Discharge Note   Patient Details  Name: Chelsey Haas MRN: [id number] Date of Birth: 15-Sep-1941  Transition of Care [facility]) CM/SW Contact:  Kermit Balo, RN Phone Number: 04/13/2023, 11:55 AM   Clinical Narrative:    PCP: Dr Al-Shaar Pt is discharging home with her spouse. She states they are together most of the time.  She doesn't use any DME at home.  She manages her own medications and denies any issues.  Her PCP is still in [place] due to her having no medicare B and using [place] for this coverage. She plans on paying for medicare B at next enrollment period and having all MD's in [place] after the change in insurance.  Medications usually filled in [place] but does use [facility] in [place] for immediate medication needs.  Home health arranged with [facility]. Information on the AVS. They will contact her for the first home visit. Son can provide transportation home.    Final next level of care: Home w Home Health Services Barriers to Discharge: No Barriers Identified   Patient Goals and CMS Choice CMS Medicare.gov Compare Post Acute Care list provided to:: Patient Choice offered to / list presented to : Patient  Discharge Placement                         Discharge Plan and Services Additional resources added to the After Visit Summary for                            [facility] Arranged: PT, OT [facility] Agency: [facility] Date [facility] Agency Contacted: 04/13/23   Representative spoke with at [facility] Agency: Morrie Sheldon  Social Determinants of Health (SDOH) Interventions SDOH Screenings   Tobacco Use: Low Risk  (04/12/2023)     Readmission Risk Interventions     No data to display

## 2023-04-13 NOTE — Progress Notes (Signed)
Discharge instructions given. Verbalized understanding.

## 2023-06-21 ENCOUNTER — Emergency Department (HOSPITAL_COMMUNITY): Payer: 59

## 2023-06-21 ENCOUNTER — Emergency Department (HOSPITAL_COMMUNITY)
Admission: EM | Admit: 2023-06-21 | Discharge: 2023-06-22 | Disposition: A | Discharge status: Home / Self Care | Payer: 59 | Attending: Emergency Medicine | Admitting: Emergency Medicine

## 2023-06-21 ENCOUNTER — Other Ambulatory Visit: Payer: Self-pay

## 2023-06-21 DIAGNOSIS — W010XXA Fall on same level from slipping, tripping and stumbling without subsequent striking against object, initial encounter: Secondary | ICD-10-CM | POA: Insufficient documentation

## 2023-06-21 DIAGNOSIS — S0081XA Abrasion of other part of head, initial encounter: Secondary | ICD-10-CM | POA: Diagnosis not present

## 2023-06-21 DIAGNOSIS — S0083XA Contusion of other part of head, initial encounter: Secondary | ICD-10-CM

## 2023-06-21 DIAGNOSIS — S0990XA Unspecified injury of head, initial encounter: Secondary | ICD-10-CM | POA: Diagnosis present

## 2023-06-21 DIAGNOSIS — Z7902 Long term (current) use of antithrombotics/antiplatelets: Secondary | ICD-10-CM | POA: Insufficient documentation

## 2023-06-21 LAB — CBC WITH DIFFERENTIAL/PLATELET
Abs Immature Granulocytes: 0.01 10*3/uL (ref 0.00–0.07)
Basophils Absolute: 0 10*3/uL (ref 0.0–0.1)
Basophils Relative: 1 %
Eosinophils Absolute: 0 10*3/uL (ref 0.0–0.5)
Eosinophils Relative: 1 %
HCT: 32.2 % — ABNORMAL LOW (ref 36.0–46.0)
Hemoglobin: 10.3 g/dL — ABNORMAL LOW (ref 12.0–15.0)
Immature Granulocytes: 0 %
Lymphocytes Relative: 15 %
Lymphs Abs: 0.9 10*3/uL (ref 0.7–4.0)
MCH: 30 pg (ref 26.0–34.0)
MCHC: 32 g/dL (ref 30.0–36.0)
MCV: 93.9 fL (ref 80.0–100.0)
Monocytes Absolute: 0.6 10*3/uL (ref 0.1–1.0)
Monocytes Relative: 9 %
Neutro Abs: 4.7 10*3/uL (ref 1.7–7.7)
Neutrophils Relative %: 74 %
Platelets: 270 10*3/uL (ref 150–400)
RBC: 3.43 MIL/uL — ABNORMAL LOW (ref 3.87–5.11)
RDW: 12.1 % (ref 11.5–15.5)
WBC: 6.3 10*3/uL (ref 4.0–10.5)
nRBC: 0 % (ref 0.0–0.2)

## 2023-06-21 LAB — COMPREHENSIVE METABOLIC PANEL
ALT: 11 U/L (ref 0–44)
AST: 16 U/L (ref 15–41)
Albumin: 3.5 g/dL (ref 3.5–5.0)
Alkaline Phosphatase: 62 U/L (ref 38–126)
Anion gap: 9 (ref 5–15)
BUN: 9 mg/dL (ref 8–23)
CO2: 26 mmol/L (ref 22–32)
Calcium: 9.2 mg/dL (ref 8.9–10.3)
Chloride: 102 mmol/L (ref 98–111)
Creatinine, Ser: 1.14 mg/dL — ABNORMAL HIGH (ref 0.44–1.00)
GFR, Estimated: 49 mL/min — ABNORMAL LOW (ref >60)
Glucose, Bld: 100 mg/dL — ABNORMAL HIGH (ref 70–99)
Potassium: 3.9 mmol/L (ref 3.5–5.1)
Sodium: 137 mmol/L (ref 135–145)
Total Bilirubin: 0.4 mg/dL (ref 0.3–1.2)
Total Protein: 6.3 g/dL — ABNORMAL LOW (ref 6.5–8.1)

## 2023-06-21 MED ORDER — GABAPENTIN 100 MG PO CAPS
100.0000 mg | ORAL_CAPSULE | Freq: Once | ORAL | Status: AC
  Administered 2023-06-21: 100 mg via ORAL
  Filled 2023-06-21: qty 1

## 2023-06-21 MED ORDER — ACETAMINOPHEN 325 MG PO TABS
650.0000 mg | ORAL_TABLET | Freq: Four times a day (QID) | ORAL | Status: DC | PRN
  Administered 2023-06-21: 650 mg via ORAL
  Filled 2023-06-21: qty 2

## 2023-06-21 NOTE — Discharge Instructions (Signed)
Tylenol for pain.  Return if any problems.  

## 2023-06-21 NOTE — ED Provider Notes (Signed)
[place] [facility] AT [facility] Provider Note   CSN: [id number] Arrival date & time: 06/21/23  1702     History  Chief Complaint  Patient presents with   Level 2 fall on thinners    Chelsey Haas is a 82 y.o. female.  She reports she was wearing flip-flops and slipped and fell.  Patient struck the right side of her head and her left knee.  Patient is on Plavix.  Patient is reported to have had a fall in July where she sustained a subdural hematoma.  Patient was brought to the emergency department by EMS due to a fall on blood thinners.  Patient complains of pain in her knee.  Patient complains of some pain to the right side of her face.  Patient states she did not lose consciousness.  Patient complains of some soreness in her neck to palpation.  Patient tells me that she has had surgery to her left leg.  And that she struck her leg and left knee  The history is provided by the patient. No language interpreter was used.       Home Medications Prior to Admission medications   Medication Sig Start Date End Date Taking? Authorizing Provider  acetaminophen (TYLENOL) 500 MG tablet Take 1,000 mg by mouth every 6 (six) hours as needed.    [provider]  clopidogrel (PLAVIX) 75 MG tablet Take 1 tablet (75 mg total) by mouth at bedtime. 04/20/23   Almon Hercules, MD  COVID-19 mRNA Vac-TriS, Pfizer, (PFIZER-BIONT COVID-19 VAC-TRIS) SUSP injection Inject into the muscle. Patient not taking: Reported on 04/12/2023 03/16/21   Judyann Munson, MD  diclofenac Sodium (VOLTAREN ARTHRITIS PAIN) 1 % GEL Apply 2 g topically 4 (four) times daily as needed (joint pain).    [provider]  lisinopril-hydrochlorothiazide (PRINZIDE,ZESTORETIC) 20-25 MG per tablet Take 1 tablet by mouth at bedtime.    [provider]  Multiple Vitamins-Minerals (MULTIVITAMIN PO) Take 0.5 tablets by mouth daily. Patient not taking: Reported on 04/12/2023    [provider]  ondansetron (ZOFRAN) 4 MG tablet Take 1 tablet (4 mg total) by mouth daily as needed for up to 365 doses for nausea or vomiting. 04/13/23   Almon Hercules, MD  pantoprazole (PROTONIX) 40 MG tablet Take 40 mg by mouth at bedtime.    [provider]  sertraline (ZOLOFT) 25 MG tablet Take 25 mg by mouth at bedtime.    [provider]  simvastatin (ZOCOR) 40 MG tablet Take 40 mg by mouth at bedtime.    [provider]      Allergies    Patient has no known allergies.    Review of Systems   Review of Systems  All other systems reviewed and are negative.   Physical Exam Updated Vital Signs BP (!) 151/66   Pulse 75   Temp 98.2 F (36.8 C) (Oral)   Resp 20   Ht 5\' 3"  (1.6 m)   Wt 64.4 kg   SpO2 96%   BMI 25.15 kg/m  Physical Exam Vitals and nursing note reviewed.  Constitutional:      Appearance: She is well-developed.  HENT:     Head: Normocephalic.     Right Ear: Tympanic membrane normal.     Left Ear: Tympanic membrane normal.     Ears:     Comments: Tender left ear and around the left ear    Nose: Nose normal.     Mouth/Throat:  Mouth: Mucous membranes are moist.  Eyes:     Pupils: Pupils are equal, round, and reactive to light.  Cardiovascular:     Rate and Rhythm: Normal rate and regular rhythm.     Pulses: Normal pulses.  Pulmonary:     Effort: Pulmonary effort is normal.  Abdominal:     General: Abdomen is flat. There is no distension.  Musculoskeletal:        General: Normal range of motion.     Cervical back: Normal range of motion.  Skin:    General: Skin is warm.     Comments: Abrasion right cheek,  Neurological:     General: No focal deficit present.     Mental Status: She is alert and oriented to person, place, and time.  Psychiatric:        Mood and Affect: Mood normal.     ED Results / Procedures / Treatments   Labs (all labs ordered are listed, but only abnormal results are displayed) Labs Reviewed   CBC WITH DIFFERENTIAL/PLATELET - Abnormal; Notable for the following components:      Result Value   RBC 3.43 (*)    Hemoglobin 10.3 (*)    HCT 32.2 (*)    All other components within normal limits  COMPREHENSIVE METABOLIC PANEL - Abnormal; Notable for the following components:   Glucose, Bld 100 (*)    Creatinine, Ser 1.14 (*)    Total Protein 6.3 (*)    GFR, Estimated 49 (*)    All other components within normal limits    EKG EKG Interpretation Date/Time:  Thursday June 21 2023 17:10:25 EDT Ventricular Rate:  70 PR Interval:  161 QRS Duration:  95 QT Interval:  374 QTC Calculation: 404 R Axis:   10  Text Interpretation: Sinus rhythm RSR' in V1 or V2, right VCD or RVH Confirmed by Alvester Chou [id number] on 06/21/2023 5:13:44 PM  Radiology DG Tibia/Fibula Left  Result Date: 06/21/2023 CLINICAL DATA:  Fall EXAM: LEFT TIBIA AND FIBULA - 2 VIEW COMPARISON:  None. FINDINGS: There is no acute fracture or dislocation. Tibial intramedullary nail is present. There are healed tibial and fibular fractures. There is no dislocation. Soft tissues are within normal limits. IMPRESSION: 1. No acute fracture or dislocation. 2. Healed tibial and fibular fractures. Electronically Signed   By: Darliss Cheney M.D.   On: 06/21/2023 19:53   DG Pelvis Portable  Result Date: 06/21/2023 CLINICAL DATA:  Fall EXAM: PORTABLE PELVIS 1-2 VIEWS COMPARISON:  04/12/2023 FINDINGS: Chronic pubic rami fractures. SI joints are non widened. No acute fracture or malalignment IMPRESSION: Chronic pubic rami fractures. No definite acute osseous abnormality. Electronically Signed   By: Jasmine Pang M.D.   On: 06/21/2023 19:51   CT Cervical Spine Wo Contrast  Result Date: 06/21/2023 CLINICAL DATA:  Fall with facial trauma EXAM: CT HEAD WITHOUT CONTRAST CT CERVICAL SPINE WITHOUT CONTRAST TECHNIQUE: Multidetector CT imaging of the head and cervical spine was performed following the standard protocol without intravenous  contrast. Multiplanar CT image reconstructions of the cervical spine were also generated. RADIATION DOSE REDUCTION: This exam was performed according to the departmental dose-optimization program which includes automated exposure control, adjustment of the mA and/or kV according to patient size and/or use of iterative reconstruction technique. COMPARISON:  04/13/2023 FINDINGS: CT HEAD FINDINGS Brain: No mass, hemorrhage or extra-axial collection. Chronic white matter ischemic changes. 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 Vascular: No hyperdense vessel or unexpected calcification. Skull: Normal. Negative for fracture or focal lesion. Sinuses/Orbits:  No acute finding. Other: None. CT CERVICAL SPINE FINDINGS Alignment: Normal. Skull base and vertebrae: No acute fracture. No primary bone lesion or focal pathologic process. Soft tissues and spinal canal: No prevertebral fluid or swelling. No visible canal hematoma. Disc levels:  No spinal canal stenosis. Upper chest: Negative. Other: None IMPRESSION: 1. No acute intracranial abnormality. 2. No acute fracture or subluxation of the cervical spine. Electronically Signed   By: Deatra Robinson M.D.   On: 06/21/2023 19:42   CT Head Wo Contrast  Result Date: 06/21/2023 CLINICAL DATA:  Fall with facial trauma EXAM: CT HEAD WITHOUT CONTRAST CT CERVICAL SPINE WITHOUT CONTRAST TECHNIQUE: Multidetector CT imaging of the head and cervical spine was performed following the standard protocol without intravenous contrast. Multiplanar CT image reconstructions of the cervical spine were also generated. RADIATION DOSE REDUCTION: This exam was performed according to the departmental dose-optimization program which includes automated exposure control, adjustment of the mA and/or kV according to patient size and/or use of iterative reconstruction technique. COMPARISON:  04/13/2023 FINDINGS: CT HEAD FINDINGS Brain: No mass, hemorrhage or extra-axial collection. Chronic white matter ischemic changes.  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 Vascular: No hyperdense vessel or unexpected calcification. Skull: Normal. Negative for fracture or focal lesion. Sinuses/Orbits: No acute finding. Other: None. CT CERVICAL SPINE FINDINGS Alignment: Normal. Skull base and vertebrae: No acute fracture. No primary bone lesion or focal pathologic process. Soft tissues and spinal canal: No prevertebral fluid or swelling. No visible canal hematoma. Disc levels:  No spinal canal stenosis. Upper chest: Negative. Other: None IMPRESSION: 1. No acute intracranial abnormality. 2. No acute fracture or subluxation of the cervical spine. Electronically Signed   By: Deatra Robinson M.D.   On: 06/21/2023 19:42    Procedures Procedures    Medications Ordered in ED Medications  acetaminophen (TYLENOL) tablet 650 mg (650 mg Oral Given 06/21/23 2152)  gabapentin (NEURONTIN) capsule 100 mg (has no administration in time range)    ED Course/ Medical Decision Making/ A&P Clinical Course as of 06/21/23 2317  Thu Jun 21, 2023  [id number] 82 year old female on Eliquis presented ED with a mechanical fall, tripped on her flip-flops, presenting with a contusion to the forehead, headache, pain in her left knee.  CT scans and x-rays ordered.  GCS is 15. [MT]    Clinical Course User Index [MT] Trifan, Kermit Balo, MD                                 Medical Decision Making Patient brought to the emergency department by EMS after falling and striking her head.  Patient denies any loss of consciousness  Amount and/or Complexity of Data Reviewed Independent Historian:     Details: Patient is here with a family member who was with her when she fell.  Family member reports patient did not lose consciousness Labs: ordered. Decision-making details documented in ED Course.    Details: Labs ordered reviewed and interpreted.  Patient's hemoglobin is 10.3 Radiology: ordered and independent interpretation performed. Decision-making details documented in ED Course.     Details: CT head and CT cervical spine show no acute abnormality X-ray pelvis and left tibi show no acute fracture.  Patient has old tibial fracture and old pelvic fractures. Discussion of management or test interpretation with external provider(s): I reviewed CT scan with Dr. Chase Picket radiologist to make sure patient did not have any fracture to the left side of her skull.  Risk  OTC drugs. Prescription drug management. Risk Details: Patient is able to stand and ambulate with minimal difficulty she has some soreness in her left knee.  Patient reports that she does have a walker at home that she can use.  Patient complains of continued pain in her left ear.  I reevaluated her left ear there is no blood tympanic membrane is intact without any evidence of injury.  Patient is tender when I palpate around her ear and the external ear.  It is advised Tylenol for pain she is advised to use her walker.  Patient advised to return to the emergency department if any problems.           Final Clinical Impression(s) / ED Diagnoses Final diagnoses:  Contusion of face, initial encounter  Abrasion of face and extremities, right, initial encounter    Rx / DC Orders ED Discharge Orders     None      An After Visit Summary was printed and given to the patient.    Osie Cheeks 06/21/23 2322    Terald Sleeper, MD 06/22/23 2006

## 2024-02-22 ENCOUNTER — Encounter (HOSPITAL_BASED_OUTPATIENT_CLINIC_OR_DEPARTMENT_OTHER): Payer: Self-pay | Admitting: Emergency Medicine

## 2024-02-22 ENCOUNTER — Emergency Department (HOSPITAL_BASED_OUTPATIENT_CLINIC_OR_DEPARTMENT_OTHER): Admission: EM | Admit: 2024-02-22 | Discharge: 2024-02-22 | Disposition: A | Discharge status: Home / Self Care

## 2024-02-22 ENCOUNTER — Other Ambulatory Visit: Payer: Self-pay

## 2024-02-22 DIAGNOSIS — Z79899 Other long term (current) drug therapy: Secondary | ICD-10-CM | POA: Insufficient documentation

## 2024-02-22 DIAGNOSIS — Z7902 Long term (current) use of antithrombotics/antiplatelets: Secondary | ICD-10-CM | POA: Insufficient documentation

## 2024-02-22 DIAGNOSIS — I159 Secondary hypertension, unspecified: Secondary | ICD-10-CM | POA: Diagnosis not present

## 2024-02-22 DIAGNOSIS — I10 Essential (primary) hypertension: Secondary | ICD-10-CM | POA: Diagnosis present

## 2024-02-22 MED ORDER — LISINOPRIL-HYDROCHLOROTHIAZIDE 20-25 MG PO TABS: 1.0000 | ORAL_TABLET | Freq: Every day | ORAL | 0 refills | Status: AC

## 2024-02-22 NOTE — Discharge Instructions (Signed)
 I have represcribed your blood pressure medication.  Please take it and follow-up with your primary doctor regarding medication adjustments.  Return if you develop sudden onset headache, vision loss, persistent dizziness or feeling off balance, chest pain, shortness of breath, abdominal pain, nausea vomiting or any new or worsening symptoms that are concerning to you.

## 2024-02-22 NOTE — ED Triage Notes (Signed)
 Pt c/o HTN, endorses out of meds x 1 month d/t insurance change. Also c/o HA, dizziness intermittently x 3 months. Pt denies CP

## 2024-02-22 NOTE — ED Provider Notes (Signed)
 [place] EMERGENCY DEPARTMENT AT [facility] Provider Note   CSN: [id number] Arrival date & time: 02/22/24  1715     History  Chief Complaint  Patient presents with   Hypertension    Chelsey Haas is a 83 y.o. female.  83 year old female presenting emergency department with concern of her blood pressure.  Has been out of her medications for a month and a half or so.  States she is felt off and has had some intermittent lightheadedness over the same duration.  No headache, vision loss, facial droop, unilateral weakness, no chest pain no shortness of breath.  She notes that she has been quite stressed with her husband that is in hospice and she has not been taking care of herself.  Decreased p.o. intake as well.     Hypertension       Home Medications Prior to Admission medications   Medication Sig Start Date End Date Taking? Authorizing Provider  acetaminophen  (TYLENOL ) 500 MG tablet Take 1,000 mg by mouth every 6 (six) hours as needed.    [provider]  clopidogrel  (PLAVIX ) 75 MG tablet Take 1 tablet (75 mg total) by mouth at bedtime. 04/20/23   Gonfa, Taye T, MD  COVID-19 mRNA Vac-TriS, Pfizer, (PFIZER-BIONT COVID-19 VAC-TRIS) SUSP injection Inject into the muscle. Patient not taking: Reported on 04/12/2023 03/16/21   Liane Redman, MD  diclofenac Sodium (VOLTAREN ARTHRITIS PAIN) 1 % GEL Apply 2 g topically 4 (four) times daily as needed (joint pain).    [provider]  lisinopril -hydrochlorothiazide  (ZESTORETIC ) 20-25 MG tablet Take 1 tablet by mouth at bedtime. 02/22/24 03/23/24  Rolinda Climes, DO  Multiple Vitamins-Minerals (MULTIVITAMIN PO) Take 0.5 tablets by mouth daily. Patient not taking: Reported on 04/12/2023    [provider]  ondansetron  (ZOFRAN ) 4 MG tablet Take 1 tablet (4 mg total) by mouth daily as needed for up to 365 doses for nausea or vomiting. 04/13/23   Gonfa, Taye T, MD  pantoprazole  (PROTONIX ) 40 MG tablet Take 40  mg by mouth at bedtime.    [provider]  sertraline  (ZOLOFT ) 25 MG tablet Take 25 mg by mouth at bedtime.    [provider]  simvastatin  (ZOCOR ) 40 MG tablet Take 40 mg by mouth at bedtime.    [provider]      Allergies    Patient has no known allergies.    Review of Systems   Review of Systems  Physical Exam Updated Vital Signs BP (!) 175/91   Pulse 73   Temp (!) 97.4 F (36.3 C)   Resp 16   Wt 65.8 kg   SpO2 99%   BMI 25.69 kg/m  Physical Exam Vitals and nursing note reviewed.  Constitutional:      General: She is not in acute distress.    Appearance: She is not toxic-appearing.  HENT:     Head: Normocephalic.     Nose: Nose normal.     Mouth/Throat:     Mouth: Mucous membranes are moist.  Eyes:     Conjunctiva/sclera: Conjunctivae normal.  Cardiovascular:     Rate and Rhythm: Normal rate and regular rhythm.  Pulmonary:     Effort: Pulmonary effort is normal.  Abdominal:     General: Abdomen is flat. There is no distension.     Palpations: Abdomen is soft.     Tenderness: There is no abdominal tenderness. There is no guarding or rebound.  Musculoskeletal:  General: Normal range of motion.  Skin:    General: Skin is warm and dry.     Capillary Refill: Capillary refill takes less than 2 seconds.  Neurological:     General: No focal deficit present.     Mental Status: She is alert and oriented to person, place, and time.     Cranial Nerves: No cranial nerve deficit.     Sensory: No sensory deficit.     Motor: No weakness.     Coordination: Coordination normal.  Psychiatric:        Behavior: Behavior normal.     ED Results / Procedures / Treatments   Labs (all labs ordered are listed, but only abnormal results are displayed) Labs Reviewed - No data to display  EKG EKG Interpretation Date/Time:  Friday Feb 22 2024 19:18:09 EDT Ventricular Rate:  62 PR Interval:  169 QRS Duration:  99 QT Interval:  439 QTC  Calculation: 446 R Axis:   45  Text Interpretation: Sinus rhythm Confirmed by Elise Guile [phone]) on 02/22/2024 8:13:05 PM  Radiology No results found.  Procedures Procedures    Medications Ordered in ED Medications - No data to display  ED Course/ Medical Decision Making/ A&P                                 Medical Decision Making Well-appearing 83 year old female presenting emergency department with concerns of elevated blood pressure.  175/91.  Essentially asymptomatic currently although she has complained of some intermittent lightheadedness.  EKG reassuring.  Appears she takes lisinopril  hydrochlorothiazide .  Will refill and have patient follow-up outpatient as per ACEP guidelines regarding asymptomatic hypertension there is no need for acute lowering here.   Amount and/or Complexity of Data Reviewed ECG/medicine tests: ordered.  Risk Prescription drug management.         Final Clinical Impression(s) / ED Diagnoses Final diagnoses:  Secondary hypertension    Rx / DC Orders ED Discharge Orders          Ordered    lisinopril -hydrochlorothiazide  (ZESTORETIC ) 20-25 MG tablet  Daily at bedtime        02/22/24 2013              Rolinda Climes, DO 02/22/24 2017

## 2024-06-27 ENCOUNTER — Other Ambulatory Visit: Payer: Self-pay | Admitting: Student

## 2024-06-27 DIAGNOSIS — R42 Dizziness and giddiness: Secondary | ICD-10-CM

## 2024-07-03 ENCOUNTER — Other Ambulatory Visit: Payer: PRIVATE HEALTH INSURANCE

## 2024-07-09 ENCOUNTER — Encounter: Payer: Self-pay | Admitting: Student

## 2024-10-07 ENCOUNTER — Inpatient Hospital Stay (HOSPITAL_COMMUNITY)
Admission: EM | Admit: 2024-10-07 | Discharge: 2024-10-12 | DRG: 482 | Disposition: A | Discharge status: Home Health | Attending: Internal Medicine | Admitting: Internal Medicine

## 2024-10-07 ENCOUNTER — Emergency Department (HOSPITAL_COMMUNITY)

## 2024-10-07 ENCOUNTER — Inpatient Hospital Stay (HOSPITAL_COMMUNITY)

## 2024-10-07 DIAGNOSIS — S72001A Fracture of unspecified part of neck of right femur, initial encounter for closed fracture: Secondary | ICD-10-CM | POA: Diagnosis present

## 2024-10-07 DIAGNOSIS — R42 Dizziness and giddiness: Secondary | ICD-10-CM

## 2024-10-07 DIAGNOSIS — Z8673 Personal history of transient ischemic attack (TIA), and cerebral infarction without residual deficits: Secondary | ICD-10-CM

## 2024-10-07 DIAGNOSIS — I129 Hypertensive chronic kidney disease with stage 1 through stage 4 chronic kidney disease, or unspecified chronic kidney disease: Secondary | ICD-10-CM | POA: Diagnosis present

## 2024-10-07 DIAGNOSIS — E876 Hypokalemia: Secondary | ICD-10-CM | POA: Diagnosis present

## 2024-10-07 DIAGNOSIS — Y92009 Unspecified place in unspecified non-institutional (private) residence as the place of occurrence of the external cause: Secondary | ICD-10-CM

## 2024-10-07 DIAGNOSIS — I10 Essential (primary) hypertension: Secondary | ICD-10-CM | POA: Diagnosis present

## 2024-10-07 DIAGNOSIS — K219 Gastro-esophageal reflux disease without esophagitis: Secondary | ICD-10-CM | POA: Diagnosis present

## 2024-10-07 DIAGNOSIS — F32A Depression, unspecified: Secondary | ICD-10-CM | POA: Diagnosis present

## 2024-10-07 DIAGNOSIS — Z79899 Other long term (current) drug therapy: Secondary | ICD-10-CM

## 2024-10-07 DIAGNOSIS — K5730 Diverticulosis of large intestine without perforation or abscess without bleeding: Secondary | ICD-10-CM | POA: Diagnosis present

## 2024-10-07 DIAGNOSIS — D509 Iron deficiency anemia, unspecified: Secondary | ICD-10-CM | POA: Diagnosis present

## 2024-10-07 DIAGNOSIS — M80051A Age-related osteoporosis with current pathological fracture, right femur, initial encounter for fracture: Principal | ICD-10-CM | POA: Diagnosis present

## 2024-10-07 DIAGNOSIS — Z7902 Long term (current) use of antithrombotics/antiplatelets: Secondary | ICD-10-CM

## 2024-10-07 DIAGNOSIS — D631 Anemia in chronic kidney disease: Secondary | ICD-10-CM | POA: Diagnosis not present

## 2024-10-07 DIAGNOSIS — Z7982 Long term (current) use of aspirin: Secondary | ICD-10-CM | POA: Diagnosis not present

## 2024-10-07 DIAGNOSIS — Z66 Do not resuscitate: Secondary | ICD-10-CM | POA: Diagnosis present

## 2024-10-07 DIAGNOSIS — N1831 Chronic kidney disease, stage 3a: Secondary | ICD-10-CM | POA: Diagnosis present

## 2024-10-07 DIAGNOSIS — W19XXXA Unspecified fall, initial encounter: Secondary | ICD-10-CM | POA: Diagnosis present

## 2024-10-07 DIAGNOSIS — S72044A Nondisplaced fracture of base of neck of right femur, initial encounter for closed fracture: Secondary | ICD-10-CM | POA: Diagnosis not present

## 2024-10-07 DIAGNOSIS — E7800 Pure hypercholesterolemia, unspecified: Secondary | ICD-10-CM | POA: Diagnosis present

## 2024-10-07 LAB — CBC WITH DIFFERENTIAL/PLATELET
Abs Immature Granulocytes: 0.04 K/uL (ref 0.00–0.07)
Basophils Absolute: 0 K/uL (ref 0.0–0.1)
Basophils Relative: 0 %
Eosinophils Absolute: 0 K/uL (ref 0.0–0.5)
Eosinophils Relative: 0 %
HCT: 36.8 % (ref 36.0–46.0)
Hemoglobin: 11.9 g/dL — ABNORMAL LOW (ref 12.0–15.0)
Immature Granulocytes: 0 %
Lymphocytes Relative: 5 %
Lymphs Abs: 0.5 K/uL — ABNORMAL LOW (ref 0.7–4.0)
MCH: 29.6 pg (ref 26.0–34.0)
MCHC: 32.3 g/dL (ref 30.0–36.0)
MCV: 91.5 fL (ref 80.0–100.0)
Monocytes Absolute: 0.5 K/uL (ref 0.1–1.0)
Monocytes Relative: 5 %
Neutro Abs: 9.1 K/uL — ABNORMAL HIGH (ref 1.7–7.7)
Neutrophils Relative %: 90 %
Platelets: 253 K/uL (ref 150–400)
RBC: 4.02 MIL/uL (ref 3.87–5.11)
RDW: 12.5 % (ref 11.5–15.5)
WBC: 10.2 K/uL (ref 4.0–10.5)
nRBC: 0 % (ref 0.0–0.2)

## 2024-10-07 LAB — BASIC METABOLIC PANEL WITH GFR
Anion gap: 12 (ref 5–15)
BUN: 19 mg/dL (ref 8–23)
CO2: 26 mmol/L (ref 22–32)
Calcium: 10.1 mg/dL (ref 8.9–10.3)
Chloride: 102 mmol/L (ref 98–111)
Creatinine, Ser: 1.28 mg/dL — ABNORMAL HIGH (ref 0.44–1.00)
GFR, Estimated: 42 mL/min — ABNORMAL LOW
Glucose, Bld: 106 mg/dL — ABNORMAL HIGH (ref 70–99)
Potassium: 3.7 mmol/L (ref 3.5–5.1)
Sodium: 140 mmol/L (ref 135–145)

## 2024-10-07 MED ORDER — BISACODYL 5 MG PO TBEC: 5.0000 mg | DELAYED_RELEASE_TABLET | Freq: Every day | ORAL | Status: DC | PRN

## 2024-10-07 MED ORDER — ACETAMINOPHEN 325 MG PO TABS: 650.0000 mg | ORAL_TABLET | Freq: Four times a day (QID) | ORAL | Status: DC | PRN

## 2024-10-07 MED ORDER — FERROUS SULFATE 325 (65 FE) MG PO TABS
325.0000 mg | ORAL_TABLET | Freq: Every day | ORAL | Status: DC
  Administered 2024-10-09 – 2024-10-12 (×4): 325 mg via ORAL
  Filled 2024-10-07 (×4): qty 1

## 2024-10-07 MED ORDER — LISINOPRIL 20 MG PO TABS
20.0000 mg | ORAL_TABLET | Freq: Every day | ORAL | Status: DC
  Administered 2024-10-08 – 2024-10-12 (×5): 20 mg via ORAL
  Filled 2024-10-07 (×5): qty 1

## 2024-10-07 MED ORDER — ACETAMINOPHEN 650 MG RE SUPP: 650.0000 mg | Freq: Four times a day (QID) | RECTAL | Status: DC | PRN

## 2024-10-07 MED ORDER — ACETAMINOPHEN 500 MG PO TABS
1000.0000 mg | ORAL_TABLET | Freq: Four times a day (QID) | ORAL | Status: DC | PRN
  Administered 2024-10-08 – 2024-10-11 (×6): 1000 mg via ORAL
  Filled 2024-10-07 (×6): qty 2

## 2024-10-07 MED ORDER — HYDROCHLOROTHIAZIDE 25 MG PO TABS
25.0000 mg | ORAL_TABLET | Freq: Every day | ORAL | Status: DC
  Administered 2024-10-08 – 2024-10-12 (×5): 25 mg via ORAL
  Filled 2024-10-07 (×5): qty 1

## 2024-10-07 MED ORDER — SIMVASTATIN 20 MG PO TABS
40.0000 mg | ORAL_TABLET | Freq: Every day | ORAL | Status: DC
  Administered 2024-10-08 – 2024-10-11 (×5): 40 mg via ORAL
  Filled 2024-10-07 (×5): qty 2

## 2024-10-07 MED ORDER — OXYCODONE HCL 5 MG PO TABS
5.0000 mg | ORAL_TABLET | Freq: Four times a day (QID) | ORAL | Status: DC | PRN
  Administered 2024-10-08 – 2024-10-10 (×4): 5 mg via ORAL
  Filled 2024-10-07 (×5): qty 1

## 2024-10-07 MED ORDER — PANTOPRAZOLE SODIUM 40 MG PO TBEC
40.0000 mg | DELAYED_RELEASE_TABLET | Freq: Every day | ORAL | Status: DC
  Administered 2024-10-08 – 2024-10-11 (×5): 40 mg via ORAL
  Filled 2024-10-07 (×5): qty 1

## 2024-10-07 MED ORDER — HYDROCODONE-ACETAMINOPHEN 5-325 MG PO TABS
1.0000 | ORAL_TABLET | Freq: Once | ORAL | Status: AC
  Administered 2024-10-07: 1 via ORAL
  Filled 2024-10-07: qty 1

## 2024-10-07 MED ORDER — LACTATED RINGERS IV SOLN: INTRAVENOUS | Status: AC

## 2024-10-07 MED ORDER — ONDANSETRON HCL 4 MG/2ML IJ SOLN
4.0000 mg | Freq: Four times a day (QID) | INTRAMUSCULAR | Status: DC | PRN
  Filled 2024-10-07: qty 2

## 2024-10-07 MED ORDER — SERTRALINE HCL 50 MG PO TABS: 25.0000 mg | ORAL_TABLET | Freq: Every day | ORAL | Status: DC

## 2024-10-07 MED ORDER — LISINOPRIL-HYDROCHLOROTHIAZIDE 20-25 MG PO TABS: 1.0000 | ORAL_TABLET | Freq: Every day | ORAL | Status: DC

## 2024-10-07 MED ORDER — ONDANSETRON HCL 4 MG PO TABS
4.0000 mg | ORAL_TABLET | Freq: Four times a day (QID) | ORAL | Status: DC | PRN
  Filled 2024-10-07: qty 1

## 2024-10-07 MED ORDER — MECLIZINE HCL 12.5 MG PO TABS: 6.2500 mg | ORAL_TABLET | Freq: Three times a day (TID) | ORAL | Status: DC | PRN

## 2024-10-07 MED ORDER — LIDOCAINE 5 % EX PTCH
1.0000 | MEDICATED_PATCH | CUTANEOUS | Status: DC
  Administered 2024-10-08 – 2024-10-12 (×4): 1 via TRANSDERMAL
  Filled 2024-10-07 (×5): qty 1

## 2024-10-07 MED ORDER — SENNOSIDES-DOCUSATE SODIUM 8.6-50 MG PO TABS
1.0000 | ORAL_TABLET | Freq: Every evening | ORAL | Status: DC | PRN
  Administered 2024-10-11: 1 via ORAL
  Filled 2024-10-07: qty 1

## 2024-10-07 NOTE — ED Provider Notes (Signed)
 " [place] EMERGENCY DEPARTMENT AT [facility] Provider Note   CSN: [id number] Arrival date & time: 10/07/24  1609     Patient presents with: Knee Pain   Chelsey Haas is a 83 y.o. female.   83 year old female presents for evaluation after a fall.  Patient states that she suffers from vertigo, she fell today and injured her right leg.  She reports primarily having pain in her right knee.  She has been unable to bear weight on this leg since her fall.  She denies hitting her head, no upper extremity, neck or back pain. Patient is on Plavix . Reports history of chronic knee pain, followed by [facility]. No improvement with Norco provided in triage.       Prior to Admission medications  Medication Sig Start Date End Date Taking? Authorizing Provider  acetaminophen  (TYLENOL ) 500 MG tablet Take 1,000 mg by mouth every 6 (six) hours as needed.    [provider]  clopidogrel  (PLAVIX ) 75 MG tablet Take 1 tablet (75 mg total) by mouth at bedtime. 04/20/23   Gonfa, Taye T, MD  COVID-19 mRNA Vac-TriS, Pfizer, (PFIZER-BIONT COVID-19 VAC-TRIS) SUSP injection Inject into the muscle. Patient not taking: Reported on 04/12/2023 03/16/21   Luiz Channel, MD  diclofenac Sodium (VOLTAREN ARTHRITIS PAIN) 1 % GEL Apply 2 g topically 4 (four) times daily as needed (joint pain).    [provider]  lisinopril -hydrochlorothiazide  (ZESTORETIC ) 20-25 MG tablet Take 1 tablet by mouth at bedtime. 02/22/24 03/23/24  Neysa Caron PARAS, DO  Multiple Vitamins-Minerals (MULTIVITAMIN PO) Take 0.5 tablets by mouth daily. Patient not taking: Reported on 04/12/2023    [provider]  ondansetron  (ZOFRAN ) 4 MG tablet Take 1 tablet (4 mg total) by mouth daily as needed for up to 365 doses for nausea or vomiting. 04/13/23   Gonfa, Taye T, MD  pantoprazole  (PROTONIX ) 40 MG tablet Take 40 mg by mouth at bedtime.    [provider]  sertraline  (ZOLOFT ) 25 MG tablet Take 25 mg by  mouth at bedtime.    [provider]  simvastatin  (ZOCOR ) 40 MG tablet Take 40 mg by mouth at bedtime.    [provider]    Allergies: Patient has no known allergies.    Review of Systems Negative except as per HPI Updated Vital Signs BP (!) 110/56   Pulse 70   Temp 98.3 F (36.8 C) (Oral)   Resp 16   SpO2 98%   Physical Exam Vitals and nursing note reviewed.  Constitutional:      General: She is not in acute distress.    Appearance: She is well-developed. She is not diaphoretic.  HENT:     Head: Normocephalic and atraumatic.  Cardiovascular:     Pulses: Normal pulses.  Pulmonary:     Effort: Pulmonary effort is normal.  Abdominal:     Palpations: Abdomen is soft.     Tenderness: There is no abdominal tenderness.  Musculoskeletal:        General: Tenderness present. No swelling or deformity.     Right lower leg: No edema.     Left lower leg: No edema.     Comments: Unable to range right hip or knee due to pain.   Skin:    General: Skin is warm and dry.     Findings: No erythema or rash.  Neurological:     Mental Status: She is alert and oriented to person, place, and time.     Sensory: No  sensory deficit.  Psychiatric:        Behavior: Behavior normal.     (all labs ordered are listed, but only abnormal results are displayed) Labs Reviewed  BASIC METABOLIC PANEL WITH GFR - Abnormal; Notable for the following components:      Result Value   Glucose, Bld 106 (*)    Creatinine, Ser 1.28 (*)    GFR, Estimated 42 (*)    All other components within normal limits  CBC WITH DIFFERENTIAL/PLATELET - Abnormal; Notable for the following components:   Hemoglobin 11.9 (*)    Neutro Abs 9.1 (*)    Lymphs Abs 0.5 (*)    All other components within normal limits    EKG: None  Radiology: DG Femur 1V Right Result Date: 10/07/2024 EXAM: _Views_ VIEW(S) XRAY OF THE RIGHT FEMUR 10/07/2024 06:27:00 PM COMPARISON: 06/21/2023 CLINICAL HISTORY: post fall  FINDINGS: BONES AND JOINTS: Healed right superior and inferior pubic rami fractures. New nondisplaced right femoral neck fracture. Degenerative changes of the right knee. No malalignment. SOFT TISSUES: The soft tissues are unremarkable. IMPRESSION: 1. New nondisplaced right femoral neck fracture. 2. Healed right superior and inferior pubic rami fractures. 3. Degenerative changes of the right knee. Electronically signed by: Greig Pique MD 10/07/2024 07:52 PM EST RP Workstation: [id number]   DG Tibia/Fibula Right Result Date: 10/07/2024 EXAM: 3 VIEW(S) XRAY OF THE LATERALITY TIBIA AND FIBULA 10/07/2024 06:27:00 PM COMPARISON: None available. CLINICAL HISTORY: post fall FINDINGS: BONES AND JOINTS: No acute fracture. No malalignment. SOFT TISSUES: The soft tissues are unremarkable. IMPRESSION: 1. No acute fracture or dislocation. Electronically signed by: Greig Pique MD 10/07/2024 07:51 PM EST RP Workstation: [id number]   DG Knee Complete 4 Views Right Result Date: 10/07/2024 EXAM: 4 VIEW(S) XRAY OF THE KNEE 10/07/2024 06:27:00 PM COMPARISON: None available. CLINICAL HISTORY: knee pain post fall FINDINGS: BONES AND JOINTS: No acute fracture. No malalignment. No significant joint effusion. 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 Mild osteoarthritis of the patellofemoral compartment. SOFT TISSUES: The soft tissues are unremarkable. IMPRESSION: 1. No acute fracture or dislocation. 2. Moderate tricompartmental osteoarthritis, most pronounced within the lateral compartment. Electronically signed by: Greig Pique MD 10/07/2024 07:47 PM EST RP Workstation: [id number]   CT Cervical Spine Wo Contrast Result Date: 10/07/2024 EXAM: CT CERVICAL SPINE WITHOUT CONTRAST 10/07/2024 06:17:47 PM TECHNIQUE: CT of the cervical spine was performed without the administration of intravenous contrast. Multiplanar reformatted images are provided for review. Automated exposure  control, iterative reconstruction, and/or weight based adjustment of the mA/kV was utilized to reduce the radiation dose to as low as reasonably achievable. COMPARISON: 06/21/2023 CLINICAL HISTORY: Neck trauma (Age >= 65y) FINDINGS: BONES AND ALIGNMENT: Trace degenerative retrolisthesis of C4 on C5. No evidence of traumatic malalignment. No compression fracture or displaced fracture in the cervical spine. Diffuse osteopenia. DEGENERATIVE CHANGES: Disc space narrowing most pronounced at C4-C5 and C5-C6 with associated degenerative endplate changes. Facet arthrosis and uncovertebral hypertrophy at multiple levels. Foraminal stenosis most pronounced on the right at C4-C5 and C5-C6. No high grade osseous spinal canal stenosis. SOFT TISSUES: No prevertebral soft tissue swelling. IMPRESSION: 1. No evidence of acute traumatic injury. Electronically signed by: Donnice Mania MD 10/07/2024 07:15 PM EST RP Workstation: [id number]   CT Head Wo Contrast Result Date: 10/07/2024 EXAM: CT HEAD WITHOUT CONTRAST 10/07/2024 06:17:47 PM TECHNIQUE: CT of the head was performed without the administration of intravenous contrast. Automated exposure control, iterative reconstruction, and/or weight based adjustment of the mA/kV was utilized to reduce  the radiation dose to as low as reasonably achievable. COMPARISON: 06/21/2023 CLINICAL HISTORY: Head trauma, minor (Age >= 65y) FINDINGS: BRAIN AND VENTRICLES: No acute hemorrhage. No evidence of acute infarct. 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 Additional small remote infarct in the right cerebellum is unchanged. Ex vacuo dilatation of left lateral ventricle. No hydrocephalus. No extra-axial collection. No mass effect or midline shift. ORBITS: No acute abnormality. SINUSES: No acute abnormality. SOFT TISSUES AND SKULL: No acute  soft tissue abnormality. No skull fracture. IMPRESSION: 1. No acute intracranial abnormality. Electronically signed by: Donnice Mania MD 10/07/2024 07:07 PM EST RP Workstation: [id number]     Procedures   Medications Ordered in the ED  HYDROcodone -acetaminophen  (NORCO/VICODIN) 5-325 MG per tablet 1 tablet (1 tablet Oral Given 10/07/24 1804)                                    Medical Decision Making Amount and/or Complexity of Data Reviewed Radiology: ordered.  Risk Decision regarding hospitalization.   This patient presents to the ED for concern of right leg pain, this involves an extensive number of treatment options, and is a complaint that carries with it a high risk of complications and morbidity.  The differential diagnosis includes but not limited to strain, contusion, fracture, dislocation    Co morbidities / Chronic conditions that complicate the patient evaluation  GERD, HTN, HLD, CVA, vertigo   Additional history obtained:  Additional history obtained from EMR External records from outside source obtained and reviewed including prior labs and imaging on file   Lab Tests:  I Ordered, and personally interpreted labs.  The pertinent results include: CBC without significant findings.  BMP with mildly elevated creatinine 1.28.   Imaging Studies ordered:  I ordered imaging studies including CT head, c-spine, XR right hip, femur, knee, tib/fib  I independently visualized and interpreted imaging which showed right hip fracture  I agree with the radiologist interpretation    Problem List / ED Course / Critical interventions / Medication management  83 year old female presents from home with fall resulting in pain in her right leg.  She has pain ranging her right knee and right hip.  She is found to have a right femoral neck fracture.  Consulted orthopedics who request hospitalist admit, n.p.o. at midnight, hold Plavix .  Discussed with hospitalist will consult for  admission. I have reviewed the patients home medicines and have made adjustments as needed   Consultations Obtained:  Patient is unsure who she sees at [facility]. Call to Dr. Sharl with [facility], will defer care to on call for [facility].  I requested consultation with the Dr. Edna,  and discussed lab and imaging findings as well as pertinent plan - they recommend: hold the Plavix , NPO after midnight, admit to hospitalist service Discussed with Dr. Lou with [facility] who will consult for admission.    Social Determinants of Health:  Lives at home, cares for husband with cancer   Test / Admission - Considered:  admit     Final diagnoses:  Fall, initial encounter  Closed fracture of neck of right femur, initial encounter [facility])    ED Discharge Orders     None          Beverley Leita LABOR, PA-C 10/07/24 2136  "

## 2024-10-07 NOTE — ED Provider Triage Note (Signed)
 Emergency Medicine Provider Triage Evaluation Note  Chelsey Haas , a 83 y.o. female  was evaluated in triage.  Pt found by EMS for a fall at home onto her right knee.  She states that she did not have a syncopal event however she felt suddenly weak in the right leg causing her to go down straight onto the knee on the right side.  She denies impacting her head or neck, does not have any neck or head pain, no dizziness, previous history of vertigo and continues to have same.  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  Pain is isolated to the anterior right knee, with decreased range of motion and swelling noted..  Review of Systems  Positive: As above Negative:   Physical Exam  BP (!) 110/56   Pulse 70   Temp 98.3 F (36.8 C) (Oral)   Resp 16   SpO2 98%  Gen:   Awake, no distress  Resp:  Normal effort  MSK:   The right knee is tender to palpation anteriorly with swelling noted to the anterior medial aspect.  Decreased range of motion passively due to pain, no deformity or crepitus appreciated. Other:    Medical Decision Making  Medically screening exam initiated at 5:48 PM.  Appropriate orders placed.  Chelsey Haas was informed that the remainder of the evaluation will be completed by another provider, this initial triage assessment does not replace that evaluation, and the importance of remaining in the ED until their evaluation is complete.  Secondary to age, CT head and neck was ordered, also imaging of the right lower extremity ordered as well as basic labs.  Initial pain management orders placed.   Chelsey Haas, [place] 10/07/24 [phone]

## 2024-10-07 NOTE — H&P (View-Only) (Signed)
 ORTHOPAEDIC CONSULTATION  REQUESTING PHYSICIAN: Lou Claretta HERO, MD  Chief Complaint: Right hip pain after fall  HPI: Chelsey Haas is a 83 y.o. female who reports a history of falls this past year.  She had a fall today injuring her right leg.  She localizes pain around the knee area.  She has been a unable to bear weight since the fall.  She is on Plavix  but reports has not had a dose since yesterday.  Past Medical History:  Diagnosis Date   Acid reflux    High cholesterol    Hypertension    Stroke (HCC)    Vertigo    Past Surgical History:  Procedure Laterality Date   ABDOMINAL HYSTERECTOMY     CHOLECYSTECTOMY     LEG SURGERY     Social History   Socioeconomic History   Marital status: Married    Spouse name: Not on file   Number of children: Not on file   Years of education: Not on file   Highest education level: Not on file  Occupational History   Not on file  Tobacco Use   Smoking status: Never   Smokeless tobacco: Never  Vaping Use   Vaping status: Never Used  Substance and Sexual Activity   Alcohol use: No   Drug use: No   Sexual activity: Not on file  Other Topics Concern   Not on file  Social History Narrative   Not on file   Social Drivers of Health   Tobacco Use: Low Risk (02/22/2024)   Patient History    Smoking Tobacco Use: Never    Smokeless Tobacco Use: Never    Passive Exposure: Not on file  Financial Resource Strain: Low Risk  (12/12/2022)   Received from [facility]   Overall Financial Resource Strain (CARDIA)    Difficulty of Paying Living Expenses: Not hard at all  Food Insecurity: No Food Insecurity (12/12/2022)   Received from [facility]   Epic    Within the past 12 months, you worried that your food would run out before you got the money to buy more.: Never true    Within the past 12 months, the food you bought just didn't last and you didn't have money to get more.: Never  true  Transportation Needs: No Transportation Needs (12/12/2022)   Received from [place]   PRAPARE - Transportation    Lack of Transportation (Medical): No    Lack of Transportation (Non-Medical): No  Physical Activity: Insufficiently Active (12/12/2022)   Received from [facility]   Exercise Vital Sign    On average, how many days per week do you engage in moderate to strenuous exercise (like a brisk walk)?: 1 day    On average, how many minutes do you engage in exercise at this level?: 30 min  Stress: Stress Concern Present (12/12/2022)   Received from [facility]   [employer] of Occupational Health - Occupational Stress Questionnaire    Feeling of Stress : Very much  Social Connections: Moderately Integrated (12/12/2022)   Received from [facility]   Social Connection and Isolation Panel    In a typical week, how many times do you talk on the phone with family, friends, or neighbors?: More than three times a week    How often do you get together with friends or relatives?: Once a week    How often do you attend church or religious  services?: 1 to 4 times per year    Do you belong to any clubs or organizations such as church groups, unions, fraternal or athletic groups, or school groups?: No    How often do you attend meetings of the clubs or organizations you belong to?: Never    Are you married, widowed, divorced, separated, never married, or living with a partner?: Married  Depression (PHQ2-9): Not on file  Alcohol Screen: Not on file  Housing: Not on file  Utilities: Not on file  Health Literacy: Not on file   No family history on file. Allergies[1]   Positive ROS: All other systems have been reviewed and were otherwise negative with the exception of those mentioned in the HPI and as above.  Physical Exam: General: Alert, no acute distress Cardiovascular: No pedal  edema Respiratory: No cyanosis, no use of accessory musculature Skin: No lesions in the area of chief complaint Neurologic: Sensation intact distally Psychiatric: Patient is competent for consent with normal mood and affect  MUSCULOSKELETAL:  RLE No traumatic wounds, ecchymosis, or rash  Pain with log roll  No knee or ankle effusion  Sens DPN, SPN, TN intact  Motor EHL, ext, flex 5/5  DP 2+, PT 2+, No significant edema   LLE No traumatic wounds, ecchymosis, or rash  Nontender  No groin pain with log roll  No knee or ankle effusion  Sens DPN, SPN, TN intact  Motor EHL, ext, flex 5/5  DP 2+, PT 2+, No significant edema  IMAGING: X-rays right hip and femur reviewed demonstrate minimally displaced right femoral neck fracture with mild valgus angulation  Assessment: Principal Problem:   Fracture of femoral neck, right, closed (HCC)  Valgus impacted right femoral neck fracture  Plan: Patient has a mildly displaced valgus impacted right femoral neck fracture.  Will get a CT scan to confirm alignment of the fracture.  Anticipate she will benefit from surgery likely close reduction cannulated screw fixation.  If there is notable displacement of the CT scan may consider hemiarthroplasty.  Will plan for surgery tomorrow.  Patient to be n.p.o. after midnight.    TORIBIO DELENA HIGASHI, MD  Contact information:   Tzzxijbd 7am-5pm epic message Dr. Higashi, or call office for patient follow up: [phone] After hours and holidays please check [email or web address] for group call information for [facility]    [1] No Known Allergies

## 2024-10-07 NOTE — Consult Note (Signed)
 ORTHOPAEDIC CONSULTATION  REQUESTING PHYSICIAN: Lou Claretta HERO, MD  Chief Complaint: Right hip pain after fall  HPI: Chelsey Haas is a 83 y.o. female who reports a history of falls this past year.  She had a fall today injuring her right leg.  She localizes pain around the knee area.  She has been a unable to bear weight since the fall.  She is on Plavix  but reports has not had a dose since yesterday.  Past Medical History:  Diagnosis Date   Acid reflux    High cholesterol    Hypertension    Stroke (HCC)    Vertigo    Past Surgical History:  Procedure Laterality Date   ABDOMINAL HYSTERECTOMY     CHOLECYSTECTOMY     LEG SURGERY     Social History   Socioeconomic History   Marital status: Married    Spouse name: Not on file   Number of children: Not on file   Years of education: Not on file   Highest education level: Not on file  Occupational History   Not on file  Tobacco Use   Smoking status: Never   Smokeless tobacco: Never  Vaping Use   Vaping status: Never Used  Substance and Sexual Activity   Alcohol use: No   Drug use: No   Sexual activity: Not on file  Other Topics Concern   Not on file  Social History Narrative   Not on file   Social Drivers of Health   Tobacco Use: Low Risk (02/22/2024)   Patient History    Smoking Tobacco Use: Never    Smokeless Tobacco Use: Never    Passive Exposure: Not on file  Financial Resource Strain: Low Risk  (12/12/2022)   Received from [facility]   Overall Financial Resource Strain (CARDIA)    Difficulty of Paying Living Expenses: Not hard at all  Food Insecurity: No Food Insecurity (12/12/2022)   Received from [facility]   Epic    Within the past 12 months, you worried that your food would run out before you got the money to buy more.: Never true    Within the past 12 months, the food you bought just didn't last and you didn't have money to get more.: Never  true  Transportation Needs: No Transportation Needs (12/12/2022)   Received from [place] [facility]   PRAPARE - Transportation    Lack of Transportation (Medical): No    Lack of Transportation (Non-Medical): No  Physical Activity: Insufficiently Active (12/12/2022)   Received from [facility]   Exercise Vital Sign    On average, how many days per week do you engage in moderate to strenuous exercise (like a brisk walk)?: 1 day    On average, how many minutes do you engage in exercise at this level?: 30 min  Stress: Stress Concern Present (12/12/2022)   Received from [facility]   [employer] of Occupational Health - Occupational Stress Questionnaire    Feeling of Stress : Very much  Social Connections: Moderately Integrated (12/12/2022)   Received from [facility]   Social Connection and Isolation Panel    In a typical week, how many times do you talk on the phone with family, friends, or neighbors?: More than three times a week    How often do you get together with friends or relatives?: Once a week    How often do you attend church or religious  services?: 1 to 4 times per year    Do you belong to any clubs or organizations such as church groups, unions, fraternal or athletic groups, or school groups?: No    How often do you attend meetings of the clubs or organizations you belong to?: Never    Are you married, widowed, divorced, separated, never married, or living with a partner?: Married  Depression (PHQ2-9): Not on file  Alcohol Screen: Not on file  Housing: Not on file  Utilities: Not on file  Health Literacy: Not on file   No family history on file. Allergies[1]   Positive ROS: All other systems have been reviewed and were otherwise negative with the exception of those mentioned in the HPI and as above.  Physical Exam: General: Alert, no acute distress Cardiovascular: No pedal  edema Respiratory: No cyanosis, no use of accessory musculature Skin: No lesions in the area of chief complaint Neurologic: Sensation intact distally Psychiatric: Patient is competent for consent with normal mood and affect  MUSCULOSKELETAL:  RLE No traumatic wounds, ecchymosis, or rash  Pain with log roll  No knee or ankle effusion  Sens DPN, SPN, TN intact  Motor EHL, ext, flex 5/5  DP 2+, PT 2+, No significant edema   LLE No traumatic wounds, ecchymosis, or rash  Nontender  No groin pain with log roll  No knee or ankle effusion  Sens DPN, SPN, TN intact  Motor EHL, ext, flex 5/5  DP 2+, PT 2+, No significant edema  IMAGING: X-rays right hip and femur reviewed demonstrate minimally displaced right femoral neck fracture with mild valgus angulation  Assessment: Principal Problem:   Fracture of femoral neck, right, closed (HCC)  Valgus impacted right femoral neck fracture  Plan: Patient has a mildly displaced valgus impacted right femoral neck fracture.  Will get a CT scan to confirm alignment of the fracture.  Anticipate she will benefit from surgery likely close reduction cannulated screw fixation.  If there is notable displacement of the CT scan may consider hemiarthroplasty.  Will plan for surgery tomorrow.  Patient to be n.p.o. after midnight.    TORIBIO DELENA HIGASHI, MD  Contact information:   Tzzxijbd 7am-5pm epic message Dr. Higashi, or call office for patient follow up: [phone] After hours and holidays please check [email or web address] for group call information for [facility]    [1] No Known Allergies

## 2024-10-07 NOTE — ED Triage Notes (Signed)
 BIB [facility] from home for mechanical fall onto right knee. On plavix . No HI or LOC. Some vertigo. Slipped out of her chair.

## 2024-10-07 NOTE — H&P (Signed)
 " History and Physical  Chelsey Haas FMW:[id number] DOB: 07-27-1941 DOA: 10/07/2024  PCP: Pcp, No   Chief Complaint: Fall, right knee pain  HPI: Chelsey Haas is a 83 y.o. female with medical history significant for HTN, CVA, HLD, GERD, and vertigo who presented to the ED for evaluation of right knee pain after a fall at home. Patient reports that she has a history of vertigo once or twice a week. When she woke up this morning she experienced some mild vertigo described as the room spinning.  As she was walking, she attempted to stabilize herself by holding onto her dresser however she was unable to hold on completely and fell on her right side. She was unable to get up due to pain in her right knee so EMS was called. She denies hitting her head or LOC. She endorses pain around her right knee and hip with movement but currently denies any pain in the area after receiving pain medication.  He has not had any recent illnesses or reports she has lost a significant amount of weight over the last 2 years due to grieving multiple siblings that passed away.   ED Course: Initial vitals show patient afebrile and normotensive. Initial labs significant for creatinine 1.28 and Hgb 11.9 otherwise unremarkable labs. CT head negative. Trauma scan shows a new nondisplaced right femoral neck fracture. Pt received Norco 5-325 mg x 1. Orthopedic surgery was consulted for evaluation. [facility] was consulted for admission.   Review of Systems: Please see HPI for pertinent positives and negatives. A complete 10 system review of systems are otherwise negative.  Past Medical History:  Diagnosis Date   Acid reflux    High cholesterol    Hypertension    Stroke [facility])    Vertigo    Past Surgical History:  Procedure Laterality Date   ABDOMINAL HYSTERECTOMY     CHOLECYSTECTOMY     LEG SURGERY     Social History:  reports that she has never smoked. She has never used smokeless tobacco. She reports that she does  not drink alcohol and does not use drugs.  Allergies[1]  No family history on file.   Prior to Admission medications  Medication Sig Start Date End Date Taking? Authorizing Provider  acetaminophen  (TYLENOL ) 500 MG tablet Take 1,000 mg by mouth every 6 (six) hours as needed.    [provider]  clopidogrel  (PLAVIX ) 75 MG tablet Take 1 tablet (75 mg total) by mouth at bedtime. 04/20/23   Gonfa, Taye T, MD  COVID-19 mRNA Vac-TriS, Pfizer, (PFIZER-BIONT COVID-19 VAC-TRIS) SUSP injection Inject into the muscle. Patient not taking: Reported on 04/12/2023 03/16/21   Luiz Channel, MD  diclofenac Sodium (VOLTAREN ARTHRITIS PAIN) 1 % GEL Apply 2 g topically 4 (four) times daily as needed (joint pain).    [provider]  lisinopril -hydrochlorothiazide  (ZESTORETIC ) 20-25 MG tablet Take 1 tablet by mouth at bedtime. 02/22/24 03/23/24  Neysa Caron PARAS, DO  Multiple Vitamins-Minerals (MULTIVITAMIN PO) Take 0.5 tablets by mouth daily. Patient not taking: Reported on 04/12/2023    [provider]  ondansetron  (ZOFRAN ) 4 MG tablet Take 1 tablet (4 mg total) by mouth daily as needed for up to 365 doses for nausea or vomiting. 04/13/23   Gonfa, Taye T, MD  pantoprazole  (PROTONIX ) 40 MG tablet Take 40 mg by mouth at bedtime.    [provider]  sertraline  (ZOLOFT ) 25 MG tablet Take 25 mg by mouth at bedtime.    [provider]  simvastatin  (  ZOCOR ) 40 MG tablet Take 40 mg by mouth at bedtime.    [provider]    Physical Exam: BP (!) 110/56   Pulse 70   Temp 98.3 F (36.8 C) (Oral)   Resp 16   SpO2 98%  General: Pleasant, well-appearing elderly woman laying in bed. No acute distress. HEENT: [place]/AT. Anicteric sclera CV: RRR. No murmurs, rubs, or gallops. No LE edema Pulmonary: Lungs CTAB. Normal effort. No wheezing or rales. Abdominal: Soft, nontender, nondistended. Normal bowel sounds. MSK: Minimal tenderness to palpation of the right hip and groin. No  tenderness to palpation of the right knee. Neurovascular intact distally. Skin: Warm and dry. No obvious rash or lesions. Neuro: A&Ox3. Moves all extremities. Normal sensation to light touch. No focal deficit. Psych: Normal mood and affect          Labs on Admission:  Basic Metabolic Panel: Recent Labs  Lab 10/07/24 1800  NA 140  K 3.7  CL 102  CO2 26  GLUCOSE 106*  BUN 19  CREATININE 1.28*  CALCIUM 10.1   Liver Function Tests: No results for input(s): AST, ALT, ALKPHOS, BILITOT, PROT, ALBUMIN in the last 168 hours. No results for input(s): LIPASE, AMYLASE in the last 168 hours. No results for input(s): AMMONIA in the last 168 hours. CBC: Recent Labs  Lab 10/07/24 1800  WBC 10.2  NEUTROABS 9.1*  HGB 11.9*  HCT 36.8  MCV 91.5  PLT 253   Cardiac Enzymes: No results for input(s): CKTOTAL, CKMB, CKMBINDEX, TROPONINI in the last 168 hours. BNP (last 3 results) No results for input(s): BNP in the last 8760 hours.  ProBNP (last 3 results) No results for input(s): PROBNP in the last 8760 hours.  CBG: No results for input(s): GLUCAP in the last 168 hours.  Radiological Exams on Admission: DG Hip Unilat W or Wo Pelvis 2-3 Views Right Result Date: 10/07/2024 EXAM: 2 OR MORE VIEW(S) XRAY OF THE RIGHT HIP 10/07/2024 08:26:29 PM COMPARISON: None available. CLINICAL HISTORY: fall, fx on femur 1v FINDINGS: BONES AND JOINTS: Nondisplaced right femoral neck fracture. Old healed right superior and inferior pubic rami fractures. No malalignment. SOFT TISSUES: Surgical clips overlie the left pelvis. LINES AND TUBES: IVC filter noted. IMPRESSION: 1. Nondisplaced right femoral neck fracture. 2. Old healed right superior and inferior pubic rami fractures. Electronically signed by: Greig Pique MD 10/07/2024 09:43 PM EST RP Workstation: [id number]   DG Femur 1V Right Result Date: 10/07/2024 EXAM: _Views_ VIEW(S) XRAY OF THE RIGHT FEMUR 10/07/2024 06:27:00  PM COMPARISON: 06/21/2023 CLINICAL HISTORY: post fall FINDINGS: BONES AND JOINTS: Healed right superior and inferior pubic rami fractures. New nondisplaced right femoral neck fracture. Degenerative changes of the right knee. No malalignment. SOFT TISSUES: The soft tissues are unremarkable. IMPRESSION: 1. New nondisplaced right femoral neck fracture. 2. Healed right superior and inferior pubic rami fractures. 3. Degenerative changes of the right knee. Electronically signed by: Greig Pique MD 10/07/2024 07:52 PM EST RP Workstation: [id number]   DG Tibia/Fibula Right Result Date: 10/07/2024 EXAM: 3 VIEW(S) XRAY OF THE LATERALITY TIBIA AND FIBULA 10/07/2024 06:27:00 PM COMPARISON: None available. CLINICAL HISTORY: post fall FINDINGS: BONES AND JOINTS: No acute fracture. No malalignment. SOFT TISSUES: The soft tissues are unremarkable. IMPRESSION: 1. No acute fracture or dislocation. Electronically signed by: Greig Pique MD 10/07/2024 07:51 PM EST RP Workstation: [id number]   DG Knee Complete 4 Views Right Result Date: 10/07/2024 EXAM: 4 VIEW(S) XRAY OF THE KNEE 10/07/2024 06:27:00 PM COMPARISON: None available. CLINICAL HISTORY: knee  pain post fall FINDINGS: BONES AND JOINTS: No acute fracture. No malalignment. No significant joint effusion. 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 Mild osteoarthritis of the patellofemoral compartment. SOFT TISSUES: The soft tissues are unremarkable. IMPRESSION: 1. No acute fracture or dislocation. 2. Moderate tricompartmental osteoarthritis, most pronounced within the lateral compartment. Electronically signed by: Greig Pique MD 10/07/2024 07:47 PM EST RP Workstation: [id number]   CT Cervical Spine Wo Contrast Result Date: 10/07/2024 EXAM: CT CERVICAL SPINE WITHOUT CONTRAST 10/07/2024 06:17:47 PM TECHNIQUE: CT of the cervical spine was performed without the administration of intravenous contrast. Multiplanar reformatted  images are provided for review. Automated exposure control, iterative reconstruction, and/or weight based adjustment of the mA/kV was utilized to reduce the radiation dose to as low as reasonably achievable. COMPARISON: 06/21/2023 CLINICAL HISTORY: Neck trauma (Age >= 65y) FINDINGS: BONES AND ALIGNMENT: Trace degenerative retrolisthesis of C4 on C5. No evidence of traumatic malalignment. No compression fracture or displaced fracture in the cervical spine. Diffuse osteopenia. DEGENERATIVE CHANGES: Disc space narrowing most pronounced at C4-C5 and C5-C6 with associated degenerative endplate changes. Facet arthrosis and uncovertebral hypertrophy at multiple levels. Foraminal stenosis most pronounced on the right at C4-C5 and C5-C6. No high grade osseous spinal canal stenosis. SOFT TISSUES: No prevertebral soft tissue swelling. IMPRESSION: 1. No evidence of acute traumatic injury. Electronically signed by: Donnice Mania MD 10/07/2024 07:15 PM EST RP Workstation: [id number]   CT Head Wo Contrast Result Date: 10/07/2024 EXAM: CT HEAD WITHOUT CONTRAST 10/07/2024 06:17:47 PM TECHNIQUE: CT of the head was performed without the administration of intravenous contrast. Automated exposure control, iterative reconstruction, and/or weight based adjustment of the mA/kV was utilized to reduce the radiation dose to as low as reasonably achievable. COMPARISON: 06/21/2023 CLINICAL HISTORY: Head trauma, minor (Age >= 65y) FINDINGS: BRAIN AND VENTRICLES: No acute hemorrhage. No evidence of acute infarct. 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 Additional small remote infarct in the right cerebellum is unchanged. Ex vacuo dilatation of left lateral ventricle. No hydrocephalus. No extra-axial collection. No mass effect or midline shift. ORBITS: No acute abnormality. SINUSES: No  acute abnormality. SOFT TISSUES AND SKULL: No acute soft tissue abnormality. No skull fracture. IMPRESSION: 1. No acute intracranial abnormality. Electronically signed by: Donnice Mania MD 10/07/2024 07:07 PM EST RP Workstation: [id number]   Assessment/Plan Chelsey Haas is a 83 y.o. female with medical history significant for HTN, CVA, HLD, GERD, CKD 3A and vertigo who presented to the ED for evaluation of right knee pain after a fall at home and admitted for right femoral neck fracture.  # Fall at home # Right femoral neck fracture - Patient with history of vertigo presented after a fall at home from a dizzy spell - Right hip x-ray shows a nondisplaced right femoral neck fracture - Orthopedic surgery following, plan for surgery tomorrow - Multimodal pain control with lidocaine  patch, as needed Tylenol  and oxycodone - Start IV LR 125 cc/h while NPO - Fall precautions  # HTN - BP stable with SBP in the 110-130s - Continue lisinopril  and hydrochlorothiazide   # CKD 3A - Creatinine of 1.28 around baseline of 1.1-1.3 - Trend renal function, avoid nephrotoxic agents  # Hx of vertigo - Check EKG - Continue PRN meclizine   # IDA - Hgb stable at 11.9 - Continue iron supplementation  # HLD - Continue simvastatin   # Hx of CVA -  Holding Plavix  per orthopedic surgery - Continue simvastatin   # GERD - Continue Protonix   # Mood disorder - Continue sertraline   DVT prophylaxis: SCDs    Code Status: Limited: Do not attempt resuscitation (DNR) -DNR-LIMITED -Do Not Intubate/DNI   Consults called: Orthopedic surgery  Family Communication: No family at bedside  Severity of Illness: The appropriate patient status for this patient is INPATIENT. Inpatient status is judged to be reasonable and necessary in order to provide the required intensity of service to ensure the patient's safety. The patient's presenting symptoms, physical exam findings, and initial radiographic and laboratory  data in the context of their chronic comorbidities is felt to place them at high risk for further clinical deterioration. Furthermore, it is not anticipated that the patient will be medically stable for discharge from the hospital within 2 midnights of admission.   * I certify that at the point of admission it is my clinical judgment that the patient will require inpatient hospital care spanning beyond 2 midnights from the point of admission due to high intensity of service, high risk for further deterioration and high frequency of surveillance required.*  Level of care: Med-Surg    Lou Claretta HERO, MD 10/07/2024, 10:51 PM [facility] Pager: [phone] Isaiah 41:10   If 7PM-7AM, please contact night-coverage [email or web address] Password [email or web address]     [1] No Known Allergies  "

## 2024-10-08 ENCOUNTER — Encounter (HOSPITAL_COMMUNITY)
Admission: EM | Disposition: A | Discharge status: Home Health | Payer: Self-pay | Source: Home / Self Care | Attending: Internal Medicine

## 2024-10-08 ENCOUNTER — Inpatient Hospital Stay (HOSPITAL_COMMUNITY): Admitting: Anesthesiology

## 2024-10-08 ENCOUNTER — Inpatient Hospital Stay (HOSPITAL_COMMUNITY)

## 2024-10-08 ENCOUNTER — Encounter (HOSPITAL_COMMUNITY): Payer: Self-pay | Admitting: Student

## 2024-10-08 ENCOUNTER — Other Ambulatory Visit: Payer: Self-pay

## 2024-10-08 DIAGNOSIS — N1831 Chronic kidney disease, stage 3a: Secondary | ICD-10-CM | POA: Diagnosis not present

## 2024-10-08 DIAGNOSIS — S72044A Nondisplaced fracture of base of neck of right femur, initial encounter for closed fracture: Secondary | ICD-10-CM

## 2024-10-08 DIAGNOSIS — I129 Hypertensive chronic kidney disease with stage 1 through stage 4 chronic kidney disease, or unspecified chronic kidney disease: Secondary | ICD-10-CM

## 2024-10-08 DIAGNOSIS — D631 Anemia in chronic kidney disease: Secondary | ICD-10-CM

## 2024-10-08 DIAGNOSIS — S72001A Fracture of unspecified part of neck of right femur, initial encounter for closed fracture: Secondary | ICD-10-CM

## 2024-10-08 HISTORY — PX: HIP PINNING,CANNULATED: SHX1758

## 2024-10-08 LAB — BASIC METABOLIC PANEL WITH GFR
Anion gap: 12 (ref 5–15)
BUN: 20 mg/dL (ref 8–23)
CO2: 26 mmol/L (ref 22–32)
Calcium: 9.6 mg/dL (ref 8.9–10.3)
Chloride: 102 mmol/L (ref 98–111)
Creatinine, Ser: 1.23 mg/dL — ABNORMAL HIGH (ref 0.44–1.00)
GFR, Estimated: 44 mL/min — ABNORMAL LOW
Glucose, Bld: 79 mg/dL (ref 70–99)
Potassium: 3.1 mmol/L — ABNORMAL LOW (ref 3.5–5.1)
Sodium: 140 mmol/L (ref 135–145)

## 2024-10-08 LAB — CBC
HCT: 29.5 % — ABNORMAL LOW (ref 36.0–46.0)
Hemoglobin: 9.7 g/dL — ABNORMAL LOW (ref 12.0–15.0)
MCH: 29.5 pg (ref 26.0–34.0)
MCHC: 32.9 g/dL (ref 30.0–36.0)
MCV: 89.7 fL (ref 80.0–100.0)
Platelets: 217 K/uL (ref 150–400)
RBC: 3.29 MIL/uL — ABNORMAL LOW (ref 3.87–5.11)
RDW: 12.4 % (ref 11.5–15.5)
WBC: 5.5 K/uL (ref 4.0–10.5)
nRBC: 0 % (ref 0.0–0.2)

## 2024-10-08 SURGERY — FIXATION, FEMUR, NECK, PERCUTANEOUS, USING SCREW
Anesthesia: General | Site: Hip | Laterality: Right

## 2024-10-08 MED ORDER — CEFAZOLIN SODIUM-DEXTROSE 2-4 GM/100ML-% IV SOLN
2.0000 g | INTRAVENOUS | Status: AC
  Administered 2024-10-08: 2 g via INTRAVENOUS
  Filled 2024-10-08: qty 100

## 2024-10-08 MED ORDER — DEXAMETHASONE SOD PHOSPHATE PF 10 MG/ML IJ SOLN
INTRAMUSCULAR | Status: DC | PRN
  Administered 2024-10-08: 10 mg via INTRAVENOUS

## 2024-10-08 MED ORDER — 0.9 % SODIUM CHLORIDE (POUR BTL) OPTIME
TOPICAL | Status: DC | PRN
  Administered 2024-10-08: 1000 mL

## 2024-10-08 MED ORDER — PHENYLEPHRINE HCL-NACL 20-0.9 MG/250ML-% IV SOLN
INTRAVENOUS | Status: AC
  Filled 2024-10-08: qty 250

## 2024-10-08 MED ORDER — CEFAZOLIN SODIUM-DEXTROSE 2-4 GM/100ML-% IV SOLN
2.0000 g | Freq: Three times a day (TID) | INTRAVENOUS | Status: AC
  Administered 2024-10-08 – 2024-10-09 (×2): 2 g via INTRAVENOUS
  Filled 2024-10-08 (×2): qty 100

## 2024-10-08 MED ORDER — CEFAZOLIN SODIUM-DEXTROSE 2-4 GM/100ML-% IV SOLN
INTRAVENOUS | Status: AC
  Filled 2024-10-08: qty 100

## 2024-10-08 MED ORDER — LIDOCAINE 2% (20 MG/ML) 5 ML SYRINGE: INTRAMUSCULAR | Status: DC | PRN

## 2024-10-08 MED ORDER — POVIDONE-IODINE 10 % EX SWAB: 2.0000 | Freq: Once | CUTANEOUS | Status: DC

## 2024-10-08 MED ORDER — SUGAMMADEX SODIUM 200 MG/2ML IV SOLN
INTRAVENOUS | Status: AC
  Filled 2024-10-08: qty 2

## 2024-10-08 MED ORDER — TRANEXAMIC ACID-NACL 1000-0.7 MG/100ML-% IV SOLN
INTRAVENOUS | Status: AC
  Filled 2024-10-08: qty 100

## 2024-10-08 MED ORDER — ONDANSETRON HCL 4 MG/2ML IJ SOLN
INTRAMUSCULAR | Status: AC
  Filled 2024-10-08: qty 2

## 2024-10-08 MED ORDER — LIDOCAINE HCL (PF) 2 % IJ SOLN
INTRAMUSCULAR | Status: DC | PRN
  Administered 2024-10-08: 60 mg via INTRADERMAL

## 2024-10-08 MED ORDER — FENTANYL CITRATE (PF) 50 MCG/ML IJ SOSY
50.0000 ug | PREFILLED_SYRINGE | INTRAMUSCULAR | Status: DC | PRN
  Administered 2024-10-08: 50 ug via INTRAVENOUS

## 2024-10-08 MED ORDER — ACETAMINOPHEN 10 MG/ML IV SOLN
INTRAVENOUS | Status: AC
  Filled 2024-10-08: qty 100

## 2024-10-08 MED ORDER — ONDANSETRON HCL 4 MG/2ML IJ SOLN
INTRAMUSCULAR | Status: DC | PRN
  Administered 2024-10-08: 4 mg via INTRAVENOUS

## 2024-10-08 MED ORDER — OXYCODONE HCL 5 MG PO TABS: 5.0000 mg | ORAL_TABLET | Freq: Four times a day (QID) | ORAL | 0 refills | Status: DC | PRN

## 2024-10-08 MED ORDER — VANCOMYCIN HCL 1000 MG IV SOLR
INTRAVENOUS | Status: AC
  Filled 2024-10-08: qty 20

## 2024-10-08 MED ORDER — FENTANYL CITRATE (PF) 100 MCG/2ML IJ SOLN
INTRAMUSCULAR | Status: AC
  Filled 2024-10-08: qty 2

## 2024-10-08 MED ORDER — ASPIRIN 81 MG PO TBEC
81.0000 mg | DELAYED_RELEASE_TABLET | Freq: Two times a day (BID) | ORAL | Status: DC
  Administered 2024-10-12: 81 mg via ORAL
  Filled 2024-10-08 (×5): qty 1

## 2024-10-08 MED ORDER — ISOPROPYL ALCOHOL 70 % SOLN
Status: AC
  Filled 2024-10-08: qty 480

## 2024-10-08 MED ORDER — ONDANSETRON HCL 4 MG PO TABS: 4.0000 mg | ORAL_TABLET | Freq: Three times a day (TID) | ORAL | 0 refills | Status: AC | PRN

## 2024-10-08 MED ORDER — PROPOFOL 10 MG/ML IV BOLUS
INTRAVENOUS | Status: AC
  Filled 2024-10-08: qty 20

## 2024-10-08 MED ORDER — STERILE WATER FOR IRRIGATION IR SOLN
Status: DC | PRN
  Administered 2024-10-08: 1000 mL

## 2024-10-08 MED ORDER — LIDOCAINE HCL (PF) 2 % IJ SOLN
INTRAMUSCULAR | Status: AC
  Filled 2024-10-08: qty 5

## 2024-10-08 MED ORDER — FENTANYL CITRATE (PF) 50 MCG/ML IJ SOSY
PREFILLED_SYRINGE | INTRAMUSCULAR | Status: AC
  Filled 2024-10-08: qty 2

## 2024-10-08 MED ORDER — PHENYLEPHRINE HCL-NACL 20-0.9 MG/250ML-% IV SOLN
INTRAVENOUS | Status: DC | PRN
  Administered 2024-10-08: 50 ug/min via INTRAVENOUS

## 2024-10-08 MED ORDER — ASPIRIN 81 MG PO TBEC: 81.0000 mg | DELAYED_RELEASE_TABLET | Freq: Two times a day (BID) | ORAL | Status: DC

## 2024-10-08 MED ORDER — TRANEXAMIC ACID-NACL 1000-0.7 MG/100ML-% IV SOLN
1000.0000 mg | INTRAVENOUS | Status: AC
  Administered 2024-10-08: 1000 mg via INTRAVENOUS
  Filled 2024-10-08: qty 100

## 2024-10-08 MED ORDER — AMISULPRIDE (ANTIEMETIC) 5 MG/2ML IV SOLN
10.0000 mg | Freq: Once | INTRAVENOUS | Status: AC
  Administered 2024-10-08: 10 mg via INTRAVENOUS

## 2024-10-08 MED ORDER — CHLORHEXIDINE GLUCONATE 4 % EX SOLN: 60.0000 mL | Freq: Once | CUTANEOUS | Status: DC

## 2024-10-08 MED ORDER — SUGAMMADEX SODIUM 200 MG/2ML IV SOLN
INTRAVENOUS | Status: DC | PRN
  Administered 2024-10-08: 200 mg via INTRAVENOUS

## 2024-10-08 MED ORDER — FENTANYL CITRATE (PF) 50 MCG/ML IJ SOSY
25.0000 ug | PREFILLED_SYRINGE | INTRAMUSCULAR | Status: DC | PRN
  Administered 2024-10-08 (×3): 50 ug via INTRAVENOUS

## 2024-10-08 MED ORDER — FENTANYL CITRATE (PF) 100 MCG/2ML IJ SOLN
INTRAMUSCULAR | Status: DC | PRN
  Administered 2024-10-08: 50 ug via INTRAVENOUS

## 2024-10-08 MED ORDER — POTASSIUM CHLORIDE CRYS ER 20 MEQ PO TBCR: 40.0000 meq | EXTENDED_RELEASE_TABLET | ORAL | Status: DC

## 2024-10-08 MED ORDER — PROPOFOL 10 MG/ML IV BOLUS
INTRAVENOUS | Status: DC | PRN
  Administered 2024-10-08: 80 mg via INTRAVENOUS

## 2024-10-08 MED ORDER — SERTRALINE HCL 50 MG PO TABS
50.0000 mg | ORAL_TABLET | Freq: Every day | ORAL | Status: DC
  Administered 2024-10-08 – 2024-10-11 (×5): 50 mg via ORAL
  Filled 2024-10-08 (×5): qty 1

## 2024-10-08 MED ORDER — ROCURONIUM BROMIDE 10 MG/ML (PF) SYRINGE
PREFILLED_SYRINGE | INTRAVENOUS | Status: DC | PRN
  Administered 2024-10-08: 50 mg via INTRAVENOUS

## 2024-10-08 MED ORDER — ACETAMINOPHEN 10 MG/ML IV SOLN: 1000.0000 mg | Freq: Four times a day (QID) | INTRAVENOUS | Status: DC

## 2024-10-08 MED ORDER — FENTANYL CITRATE (PF) 50 MCG/ML IJ SOSY
PREFILLED_SYRINGE | INTRAMUSCULAR | Status: AC
  Filled 2024-10-08: qty 1

## 2024-10-08 MED ORDER — AMISULPRIDE (ANTIEMETIC) 5 MG/2ML IV SOLN
INTRAVENOUS | Status: AC
  Filled 2024-10-08: qty 4

## 2024-10-08 MED ORDER — ISOPROPYL ALCOHOL 70 % SOLN
Status: DC | PRN
  Administered 2024-10-08: 1 via TOPICAL

## 2024-10-08 SURGICAL SUPPLY — 29 items
BAG COUNTER SPONGE SURGICOUNT (BAG) ×1 IMPLANT
BIT DRILL 4.8X300 (BIT) IMPLANT
BRUSH SCRUB EZ PLAIN DRY (MISCELLANEOUS) ×1 IMPLANT
CHLORAPREP W/TINT 26 (MISCELLANEOUS) ×1 IMPLANT
COVER SURGICAL LIGHT HANDLE (MISCELLANEOUS) ×1 IMPLANT
DERMABOND ADVANCED .7 DNX12 (GAUZE/BANDAGES/DRESSINGS) IMPLANT
DRAPE C-ARM 42X120 X-RAY (DRAPES) ×1 IMPLANT
DRAPE C-ARMOR (DRAPES) ×1 IMPLANT
DRAPE U-SHAPE 47X51 STRL (DRAPES) ×2 IMPLANT
DRESSING MEPILEX FLEX 4X4 (GAUZE/BANDAGES/DRESSINGS) IMPLANT
ELECT REM PT RETURN 15FT ADLT (MISCELLANEOUS) ×1 IMPLANT
GLOVE BIO SURGEON STRL SZ 6.5 (GLOVE) ×3 IMPLANT
GLOVE BIOGEL PI IND STRL 6.5 (GLOVE) ×1 IMPLANT
GLOVE BIOGEL PI IND STRL 8 (GLOVE) ×1 IMPLANT
GLOVE SURG ORTHO 8.0 STRL STRW (GLOVE) ×1 IMPLANT
GOWN STRL REUS W/ TWL LRG LVL3 (GOWN DISPOSABLE) ×1 IMPLANT
GOWN STRL REUS W/ TWL XL LVL3 (GOWN DISPOSABLE) ×1 IMPLANT
KIT BASIN OR (CUSTOM PROCEDURE TRAY) ×1 IMPLANT
KIT TURNOVER KIT A (KITS) ×1 IMPLANT
MANIFOLD NEPTUNE II (INSTRUMENTS) ×1 IMPLANT
NS IRRIG 1000ML POUR BTL (IV SOLUTION) ×1 IMPLANT
PACK GENERAL/GYN (CUSTOM PROCEDURE TRAY) ×1 IMPLANT
PAD ARMBOARD POSITIONER FOAM (MISCELLANEOUS) ×2 IMPLANT
PIN GUIDE DRIL TIP 2.8X300 STE (PIN) IMPLANT
SUT MNCRL AB 3-0 PS2 18 (SUTURE) ×1 IMPLANT
SUT VIC AB 0 CT1 36 (SUTURE) ×1 IMPLANT
SUT VIC AB 2-0 CT1 TAPERPNT 27 (SUTURE) ×1 IMPLANT
TOWEL GREEN STERILE FF (TOWEL DISPOSABLE) ×3 IMPLANT
WATER STERILE IRR 1000ML POUR (IV SOLUTION) ×1 IMPLANT

## 2024-10-08 NOTE — Anesthesia Procedure Notes (Signed)
 Procedure Name: Intubation Date/Time: 10/08/2024 3:06 PM  Performed by: Carleton Garnette SAUNDERS, CRNAPre-anesthesia Checklist: Patient identified, Emergency Drugs available, Suction available, Patient being monitored and Timeout performed Patient Re-evaluated:Patient Re-evaluated prior to induction Oxygen Delivery Method: Circle system utilized Preoxygenation: Pre-oxygenation with 100% oxygen Induction Type: IV induction Ventilation: Mask ventilation without difficulty Laryngoscope Size: Mac and 3 Grade View: Grade I Tube type: Oral Number of attempts: 1 Airway Equipment and Method: Stylet Placement Confirmation: ETT inserted through vocal cords under direct vision, positive ETCO2 and breath sounds checked- equal and bilateral Secured at: 19 cm Tube secured with: Tape Dental Injury: Teeth and Oropharynx as per pre-operative assessment

## 2024-10-08 NOTE — Interval H&P Note (Signed)
 The patient has been re-examined, and the chart reviewed, and there have been no interval changes to the documented history and physical.    Plan for right hip cannulated screw fixation for valgus impacted femoral neck fracture  The operative side was examined and the patient was confirmed to have sensation to DPN, SPN, TN intact, Motor EHL, ext, flex 5/5, and DP 2+, PT 2+, No significant edema.   The risks, benefits, and alternatives have been discussed at length with patient, and the patient is willing to proceed.  Right hip marked. Consent has been signed.

## 2024-10-08 NOTE — Discharge Instructions (Signed)
 Orthopedic Discharge Instructions  Diet: As you were doing prior to hospitalization   Shower:  May shower but keep the wounds dry, use an occlusive plastic wrap, NO SOAKING IN TUB.  If the bandage gets wet, change with a clean dry gauze.  If you have a splint on, leave the splint in place and keep the splint dry with a plastic bag.  Dressing:  You may change your dressing 3-5 days after surgery.  If the dressing remains clean and dry it can also be left on until follow up.  If you change the dressing replace with clean gauze and tape or ace wrap.  For most surgeries, the stitches used are dissolvable and don't need to be removed.  However, depending on your surgery, you may have stitches that will need to be removed in the office in about 2 weeks.    Activity:  Increase activity slowly as tolerated, but follow the weight bearing instructions below.  Do not drive for the next 4-6 weeks.  In addition, you cannot be taking narcotics while you drive, and you must feel in control of the vehicle.    Weight Bearing:   You may bear weight on the surgical leg as tolerated.    Blood clot prevention (DVT Prophylaxis): After surgery you are at an increased risk for a blood clot. you were prescribed a blood thinner, Aspirin 81mg , to be taken twice daily for a total of 4 weeks from surgery to help reduce your risk of getting a blood clot. Signs of a pulmonary embolus (blood clot in the lungs) include sudden short of breath, feeling lightheaded or dizzy, chest pain with a deep breath, rapid pulse rapid breathing.  Signs of a blood clot in your arms or legs include new unexplained swelling and cramping, warm, red or darkened skin around the painful area.  Please call the office or 911 right away if these signs or symptoms develop.  To prevent constipation: you may use a stool softener such as - Colace (over the counter) 100 mg by mouth twice a day  Drink plenty of fluids (prune juice may be helpful) and high fiber  foods Miralax  (over the counter) for constipation as needed.    Itching:  If you experience itching with your medications, try taking only a single pain pill, or even half a pain pill at a time.  You may take up to 10 pain pills per day, and you can also use benadryl  over the counter for itching or also to help with sleep.   Precautions:  If you experience chest pain or shortness of breath - call 911 immediately for transfer to the hospital emergency department!!  Call office [phone]) for the following: Temperature greater than 101F Persistent nausea and vomiting Severe uncontrolled pain Redness, tenderness, or signs of infection (pain, swelling, redness, odor or green/yellow discharge around the site) Difficulty breathing, headache or visual disturbances Hives Persistent dizziness or light-headedness Extreme fatigue Any other questions or concerns you may have after discharge  In an emergency, call 911 or go to an Emergency Department at a nearby hospital  Follow- Up Appointment:  Please call for an appointment to be seen approximately 2-3 week after surgery in [facility] with your surgeon Dr. Toribio Higashi - [phone] Address: [address], [place], [place] [address]

## 2024-10-08 NOTE — Transfer of Care (Signed)
 Immediate Anesthesia Transfer of Care Note  Patient: Chelsey Haas  Procedure(s) Performed: FIXATION, FEMUR, NECK, PERCUTANEOUS, USING SCREW (Right: Hip)  Patient Location: PACU  Anesthesia Type:General  Level of Consciousness: sedated  Airway & Oxygen Therapy: Patient Spontanous Breathing and Patient connected to face mask oxygen  Post-op Assessment: Report given to RN and Post -op Vital signs reviewed and stable  Post vital signs: Reviewed and stable  Last Vitals:  Vitals Value Taken Time  BP 143/54 10/08/24 16:00  Temp    Pulse 75 10/08/24 16:01  Resp 14 10/08/24 16:01  SpO2 100 % 10/08/24 16:01  Vitals shown include unfiled device data.  Last Pain:  Vitals:   10/08/24 1349  TempSrc: Oral  PainSc:          Complications: No notable events documented.

## 2024-10-08 NOTE — Progress Notes (Signed)
 " PROGRESS NOTE    Chelsey Haas  FMW:[id number] DOB: 08-28-1941 DOA: 10/07/2024 PCP: Pcp, No    Brief Narrative:   Chelsey Haas is a 83 y.o. female with past medical history significant for HTN, HLD, CVA, GERD, vertigo who presented to [facility] ED on 10/07/2024 from home via EMS with right knee pain following mechanical fall at home.  Patient reports history of vertigo, usually occurring once/twice per week. When she woke up this morning she experienced some mild vertigo described as the room spinning.  As she was walking, she attempted to stabilize herself by holding onto her dresser however she was unable to hold on completely and fell on her right side. She was unable to get up due to pain in her right knee so EMS was called. She denies hitting her head or LOC. She endorses pain around her right knee and hip with movement but currently denies any pain in the area after receiving pain medication.  He has not had any recent illnesses or reports she has lost a significant amount of weight over the last 2 years due to grieving multiple siblings that passed away.   In the ED, temperature 98.3 F, HR 70, RR 16, BP 110/56, SpO2 98% room air.  WBC 10.2, hemoglobin 11.9, platelet count 253.  Sodium 140, potassium 3.7, chloride 102, CO2 26, glucose 106, BUN 19, creatinine 1.28.  CT head/C-spine with no acute intracranial normality, no evidence of acute traumatic injury.  CT of right hip with acute mildly impacted, anatomically aligned subcapital right femoral neck fracture without dislocation, remote healed fracture right superior and inferior pubic rami, severe sigmoid diverticulosis.  Orthopedics consulted.  TRH consulted for admission for further evaluation and management of right femoral neck fracture.  Assessment & Plan:   Acute impacted right femoral neck fracture Patient presenting to ED follow mechanical fall at home with associated right knee pain.  CT right hip with acute mildly impacted  anatomically aligned subcapital right femoral neck fracture without dislocation. -- Orthopedics, Dr. Edna following; appreciate assistance -- Tylenol  650 as needed mild pain, fever -- Oxycodone 5 g p.o. every 6 hours as needed moderate pain -- NPO for planned surgical management today -- PT/OT evaluation tomorrow; may need home health versus SNF placement (TOC consulted) -- CBC in a.m.  Hypokalemia Potassium 3.1, will replete. --Repeat electrolytes in a.m.  HTN -- Hydrochlorothiazide  25 mg p.o. daily -- Lisinopril  20 g p.o. daily  HLD -- Simvastatin  40 mams p.o. daily  GERD -- Protonix  40 mg p.o. daily  Depression -- Sertraline  50 mg p.o. nightly  Vertigo Takes meclizine  as needed at home.   DVT prophylaxis: SCDs Start: 10/07/24 2231    Code Status: Full Code Family Communication: No family present at bedside this morning  Disposition Plan:  Level of care: Med-Surg Status is: Inpatient Remains inpatient appropriate because: Pending surgical repair of right femoral neck fracture by orthopedics today    Consultants:  Orthopedics, Dr. Edna  Procedures:  None  Antimicrobials:  None   Subjective: Patient seen examined bedside, lying in bed.  Complaining of pain to her right leg.  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  Discussed with patient we will see what recommendations from therapy are tomorrow.  No other questions or concerns at this time.  Awaiting operative management for right femoral neck fracture today by orthopedics.  Denies headache, no dizziness, no chest pain,  no palpitations, no shortness of breath, no abdominal pain, no fever/chills/night sweats, no nausea/vomiting/diarrhea, no focal weakness, no fatigue, no paresthesia.  No acute events overnight per nurse staff.  Objective: Vitals:   10/08/24 0128 10/08/24 0622 10/08/24 1349 10/08/24  1433  BP: (!) 148/84 (!) 135/56 125/63   Pulse: 68 65 70   Resp: 16 16 18    Temp: 98.7 F (37.1 C) 98.7 F (37.1 C) 98.8 F (37.1 C)   TempSrc: Oral Oral Oral   SpO2: 100% 98% 99%   Weight:    61.2 kg  Height:    5' 3 (1.6 m)    Intake/Output Summary (Last 24 hours) at 10/08/2024 1514 Last data filed at 10/08/2024 1000 Gross per 24 hour  Intake 554.17 ml  Output 200 ml  Net 354.17 ml   Filed Weights   10/08/24 1433  Weight: 61.2 kg    Examination:  Physical Exam: GEN: NAD, alert and oriented x 3, wd/wn HEENT: NCAT, PERRL, EOMI, sclera clear, MMM PULM: CTAB w/o wheezes/crackles, normal respiratory effort, on room air with SpO2 99% at rest CV: RRR w/o M/G/R GI: abd soft, NTND, NABS, no R/G/M MSK: no peripheral edema, slight TTP right hip, neurovasc intact; did not attempt range of motion as pending surgical invention this afternoon NEURO: No focal neurological deficit PSYCH: normal mood/affect Integumentary: dry/intact, no rashes or wounds    Data Reviewed: I have personally reviewed following labs and imaging studies  CBC: Recent Labs  Lab 10/07/24 1800 10/08/24 0539  WBC 10.2 5.5  NEUTROABS 9.1*  --   HGB 11.9* 9.7*  HCT 36.8 29.5*  MCV 91.5 89.7  PLT 253 217   Basic Metabolic Panel: Recent Labs  Lab 10/07/24 1800 10/08/24 0539  NA 140 140  K 3.7 3.1*  CL 102 102  CO2 26 26  GLUCOSE 106* 79  BUN 19 20  CREATININE 1.28* 1.23*  CALCIUM 10.1 9.6   GFR: Estimated Creatinine Clearance: 29.7 mL/min (A) (by C-G formula based on SCr of 1.23 mg/dL (H)). Liver Function Tests: No results for input(s): AST, ALT, ALKPHOS, BILITOT, PROT, ALBUMIN in the last 168 hours. No results for input(s): LIPASE, AMYLASE in the last 168 hours. No results for input(s): AMMONIA in the last 168 hours. Coagulation Profile: No results for input(s): INR, PROTIME in the last 168 hours. Cardiac Enzymes: No results for input(s): CKTOTAL, CKMB,  CKMBINDEX, TROPONINI in the last 168 hours. BNP (last 3 results) No results for input(s): PROBNP in the last 8760 hours. HbA1C: No results for input(s): HGBA1C in the last 72 hours. CBG: No results for input(s): GLUCAP in the last 168 hours. Lipid Profile: No results for input(s): CHOL, HDL, LDLCALC, TRIG, CHOLHDL, LDLDIRECT in the last 72 hours. Thyroid Function Tests: No results for input(s): TSH, T4TOTAL, FREET4, T3FREE, THYROIDAB in the last 72 hours. Anemia Panel: No results for input(s): VITAMINB12, FOLATE, FERRITIN, TIBC, IRON, RETICCTPCT in the last 72 hours. Sepsis Labs: No results for input(s): PROCALCITON, LATICACIDVEN in the last 168 hours.  No results found for this or any previous visit (from the past 240 hours).       Radiology Studies: CT HIP RIGHT WO CONTRAST Result Date: 10/07/2024 EXAM: CT OF THE RIGHT HIP WITHOUT IV CONTRAST 10/07/2024 09:53:47 PM TECHNIQUE: CT of the right hip was performed without the administration of intravenous contrast. Multiplanar reformatted images are provided for review. Automated exposure control, iterative reconstruction, and/or weight based adjustment of the mA/kV was utilized to reduce the radiation dose  to as low as reasonably achievable. COMPARISON: None available. CLINICAL HISTORY: Fracture, hip FINDINGS: BONES: There is an acute, mildly impacted anatomically aligned subcapital right femoral neck fracture. No dislocation. Remote healed fracture of the right superior and inferior pubic rami. No aggressive appearing osseous abnormality or periostitis. SOFT TISSUE: No significant soft tissue edema or fluid collections. No soft tissue mass. 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 No osseous erosions. INTRAPELVIC CONTENTS: Severe sigmoid diverticulosis incidentally noted. IMPRESSION: 1. Acute, mildly impacted, anatomically aligned subcapital right femoral neck  fracture. No dislocation. 2. Remote healed fracture of the right superior and inferior pubic rami. 3. Severe sigmoid diverticulosis. Electronically signed by: Dorethia Molt MD 10/07/2024 11:57 PM EST RP Workstation: [id number]   DG Hip Unilat W or Wo Pelvis 2-3 Views Right Result Date: 10/07/2024 EXAM: 2 OR MORE VIEW(S) XRAY OF THE RIGHT HIP 10/07/2024 08:26:29 PM COMPARISON: None available. CLINICAL HISTORY: fall, fx on femur 1v FINDINGS: BONES AND JOINTS: Nondisplaced right femoral neck fracture. Old healed right superior and inferior pubic rami fractures. No malalignment. SOFT TISSUES: Surgical clips overlie the left pelvis. LINES AND TUBES: IVC filter noted. IMPRESSION: 1. Nondisplaced right femoral neck fracture. 2. Old healed right superior and inferior pubic rami fractures. Electronically signed by: Greig Pique MD 10/07/2024 09:43 PM EST RP Workstation: [id number]   DG Femur 1V Right Result Date: 10/07/2024 EXAM: _Views_ VIEW(S) XRAY OF THE RIGHT FEMUR 10/07/2024 06:27:00 PM COMPARISON: 06/21/2023 CLINICAL HISTORY: post fall FINDINGS: BONES AND JOINTS: Healed right superior and inferior pubic rami fractures. New nondisplaced right femoral neck fracture. Degenerative changes of the right knee. No malalignment. SOFT TISSUES: The soft tissues are unremarkable. IMPRESSION: 1. New nondisplaced right femoral neck fracture. 2. Healed right superior and inferior pubic rami fractures. 3. Degenerative changes of the right knee. Electronically signed by: Greig Pique MD 10/07/2024 07:52 PM EST RP Workstation: [id number]   DG Tibia/Fibula Right Result Date: 10/07/2024 EXAM: 3 VIEW(S) XRAY OF THE LATERALITY TIBIA AND FIBULA 10/07/2024 06:27:00 PM COMPARISON: None available. CLINICAL HISTORY: post fall FINDINGS: BONES AND JOINTS: No acute fracture. No malalignment. SOFT TISSUES: The soft tissues are unremarkable. IMPRESSION: 1. No acute fracture or dislocation. Electronically signed by: Greig Pique MD  10/07/2024 07:51 PM EST RP Workstation: [id number]   DG Knee Complete 4 Views Right Result Date: 10/07/2024 EXAM: 4 VIEW(S) XRAY OF THE KNEE 10/07/2024 06:27:00 PM COMPARISON: None available. CLINICAL HISTORY: knee pain post fall FINDINGS: BONES AND JOINTS: No acute fracture. No malalignment. No significant joint effusion. 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 Mild osteoarthritis of the patellofemoral compartment. SOFT TISSUES: The soft tissues are unremarkable. IMPRESSION: 1. No acute fracture or dislocation. 2. Moderate tricompartmental osteoarthritis, most pronounced within the lateral compartment. Electronically signed by: Greig Pique MD 10/07/2024 07:47 PM EST RP Workstation: [id number]   CT Cervical Spine Wo Contrast Result Date: 10/07/2024 EXAM: CT CERVICAL SPINE WITHOUT CONTRAST 10/07/2024 06:17:47 PM TECHNIQUE: CT of the cervical spine was performed without the administration of intravenous contrast. Multiplanar reformatted images are provided for review. Automated exposure control, iterative reconstruction, and/or weight based adjustment of the mA/kV was utilized to reduce the radiation dose to as low as reasonably achievable. COMPARISON: 06/21/2023 CLINICAL HISTORY: Neck trauma (Age >= 65y) FINDINGS: BONES AND ALIGNMENT: Trace degenerative retrolisthesis of C4 on C5. No evidence of traumatic malalignment. No compression fracture or displaced fracture in the cervical spine. Diffuse osteopenia. DEGENERATIVE CHANGES: Disc space narrowing most pronounced at C4-C5 and C5-C6 with associated  degenerative endplate changes. Facet arthrosis and uncovertebral hypertrophy at multiple levels. Foraminal stenosis most pronounced on the right at C4-C5 and C5-C6. No high grade osseous spinal canal stenosis. SOFT TISSUES: No prevertebral soft tissue swelling. IMPRESSION: 1. No evidence of acute traumatic injury. Electronically signed by: Donnice Mania MD 10/07/2024  07:15 PM EST RP Workstation: [id number]   CT Head Wo Contrast Result Date: 10/07/2024 EXAM: CT HEAD WITHOUT CONTRAST 10/07/2024 06:17:47 PM TECHNIQUE: CT of the head was performed without the administration of intravenous contrast. Automated exposure control, iterative reconstruction, and/or weight based adjustment of the mA/kV was utilized to reduce the radiation dose to as low as reasonably achievable. COMPARISON: 06/21/2023 CLINICAL HISTORY: Head trauma, minor (Age >= 65y) FINDINGS: BRAIN AND VENTRICLES: No acute hemorrhage. No evidence of acute infarct. 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 Additional small remote infarct in the right cerebellum is unchanged. Ex vacuo dilatation of left lateral ventricle. No hydrocephalus. No extra-axial collection. No mass effect or midline shift. ORBITS: No acute abnormality. SINUSES: No acute abnormality. SOFT TISSUES AND SKULL: No acute soft tissue abnormality. No skull fracture. IMPRESSION: 1. No acute intracranial abnormality. Electronically signed by: Donnice Mania MD 10/07/2024 07:07 PM EST RP Workstation: [id number]        Scheduled Meds:  chlorhexidine  60 mL Topical Once   [MAR Hold] ferrous sulfate  325 mg Oral Q breakfast   [MAR Hold] lisinopril   20 mg Oral Daily   And   [MAR Hold] hydrochlorothiazide   25 mg Oral Daily   [MAR Hold] lidocaine   1 patch Transdermal Q24H   [MAR Hold] pantoprazole   40 mg Oral QHS   phenylephrine       potassium chloride   40 mEq Oral Q4H   povidone-iodine  2 Application Topical Once   [MAR Hold] sertraline   50 mg Oral QHS   [MAR Hold] simvastatin   40 mg Oral QHS   Continuous Infusions:  ceFAZolin      ceFAZolin (ANCEF) IV     lactated ringers 125 mL/hr at 10/08/24 1459   tranexamic acid     tranexamic acid       LOS: 1 day    Time spent: 54  minutes spent on 10/08/2024 caring for this patient face-to-face including chart review, ordering labs/tests, documenting, discussion with nursing staff, consultants, updating family and interview/physical exam    Camellia PARAS Dejon Lukas, DO [facility] Available via Epic secure chat 7am-7pm After these hours, please refer to coverage provider listed on [email or web address] 10/08/2024, 3:14 PM   "

## 2024-10-08 NOTE — Anesthesia Preprocedure Evaluation (Signed)
"                                    Anesthesia Evaluation  Patient identified by MRN, date of birth, ID band Patient awake    Reviewed: Allergy & Precautions, NPO status , Patient's Chart, lab work & pertinent test results  Airway Mallampati: I  TM Distance: >3 FB Neck ROM: Full    Dental  (+) Teeth Intact, Dental Advisory Given   Pulmonary neg pulmonary ROS   breath sounds clear to auscultation       Cardiovascular hypertension, Pt. on medications  Rhythm:Regular Rate:Normal     Neuro/Psych CVA    GI/Hepatic Neg liver ROS,GERD  Medicated,,  Endo/Other  negative endocrine ROS    Renal/GU Renal disease     Musculoskeletal   Abdominal   Peds  Hematology  (+) Blood dyscrasia, anemia   Anesthesia Other Findings   Reproductive/Obstetrics                              Anesthesia Physical Anesthesia Plan  ASA: 3  Anesthesia Plan: General   Post-op Pain Management: Tylenol  PO (pre-op)*   Induction: Intravenous  PONV Risk Score and Plan: 4 or greater and Ondansetron  and Treatment may vary due to age or medical condition  Airway Management Planned: Oral ETT  Additional Equipment: None  Intra-op Plan:   Post-operative Plan: Extubation in OR  Informed Consent: I have reviewed the patients History and Physical, chart, labs and discussed the procedure including the risks, benefits and alternatives for the proposed anesthesia with the patient or authorized representative who has indicated his/her understanding and acceptance.     Dental advisory given  Plan Discussed with: CRNA  Anesthesia Plan Comments: (After significant discussion, Ms. Wardrop would like the DNR removed from her chart. )        Anesthesia Quick Evaluation  "

## 2024-10-08 NOTE — Op Note (Signed)
 10/08/2024  3:52 PM  PATIENT:  Chelsey Haas    PRE-OPERATIVE DIAGNOSIS: Right valgus impacted femoral neck fracture  POST-OPERATIVE DIAGNOSIS:  Same  PROCEDURE: Cannulated screw fixation right valgus impacted femoral neck fracture  SURGEON:  Niv Darley A Shareta Fishbaugh, MD  PHYSICIAN ASSISTANT: none   ANESTHESIA:   General  ESTIMATED BLOOD LOSS: 25cc.  PREOPERATIVE INDICATIONS:  Chelsey Haas is a  83 y.o. female who fell and was found to have a diagnosis of right valgus impacted femoral neck fracture who elected for surgical management.    The risks benefits and alternatives were discussed with the patient preoperatively including but not limited to the risks of infection, bleeding, nerve injury, cardiopulmonary complications, blood clots, malunion, nonunion, avascular necrosis, the need for revision surgery, the potential for conversion to hemiarthroplasty, among others, and the patient was willing to proceed.  OPERATIVE IMPLANTS:  3 partially-threaded 6.5 cannulated screws   OPERATIVE FINDINGS: Clinical osteoporosis with weak bone, proximal femur  OPERATIVE PROCEDURE: The patient was brought to the operating room and placed in supine position. IV antibiotics were given. General anesthesia administered. Foley was also given. The patient was placed on the fracture table. The operative extremity was positioned, without any significant reduction maneuver and was prepped and draped in usual sterile fashion.  Time out was performed.  Small incision was made distal to the greater trochanter, and 3 guidewires were introduced Into an inverted triangle configuration. The lengths were measured. The reduction was slightly valgus, and near-anatomic. I opened the cortex with a cannulated drill, and then placed the screws into position. Satisfactory fixation was achieved.  The wounds were irrigated copiously, and repaired with Vicryl, monocryl with Dermabond and Mepilex dressing.  There  no complications and the patient tolerated the procedure well.   Post op recs: WB: WBAT RLE Abx: ancef x23 hours post op Imaging: PACU xrays Dressing: keep intact until follow up, change PRN if soiled or saturated. DVT prophylaxis: Aspirin 81 mg twice daily Follow up: 2 weeks after surgery for a wound check with Dr. Edna at [facility].  Address: [address], [place], [place] [address]  Office Phone: [phone]   Toribio Edna, MD

## 2024-10-08 NOTE — Plan of Care (Signed)

## 2024-10-09 DIAGNOSIS — S72001A Fracture of unspecified part of neck of right femur, initial encounter for closed fracture: Secondary | ICD-10-CM | POA: Diagnosis not present

## 2024-10-09 LAB — BASIC METABOLIC PANEL WITH GFR
Anion gap: 15 (ref 5–15)
BUN: 19 mg/dL (ref 8–23)
CO2: 24 mmol/L (ref 22–32)
Calcium: 9.5 mg/dL (ref 8.9–10.3)
Chloride: 97 mmol/L — ABNORMAL LOW (ref 98–111)
Creatinine, Ser: 1.3 mg/dL — ABNORMAL HIGH (ref 0.44–1.00)
GFR, Estimated: 41 mL/min — ABNORMAL LOW
Glucose, Bld: 99 mg/dL (ref 70–99)
Potassium: 3.6 mmol/L (ref 3.5–5.1)
Sodium: 136 mmol/L (ref 135–145)

## 2024-10-09 LAB — CBC
HCT: 29 % — ABNORMAL LOW (ref 36.0–46.0)
Hemoglobin: 10.1 g/dL — ABNORMAL LOW (ref 12.0–15.0)
MCH: 30.9 pg (ref 26.0–34.0)
MCHC: 34.8 g/dL (ref 30.0–36.0)
MCV: 88.7 fL (ref 80.0–100.0)
Platelets: 237 K/uL (ref 150–400)
RBC: 3.27 MIL/uL — ABNORMAL LOW (ref 3.87–5.11)
RDW: 12.3 % (ref 11.5–15.5)
WBC: 6.2 K/uL (ref 4.0–10.5)
nRBC: 0 % (ref 0.0–0.2)

## 2024-10-09 LAB — MAGNESIUM: Magnesium: 1.8 mg/dL (ref 1.7–2.4)

## 2024-10-09 MED ORDER — DICLOFENAC SODIUM 1 % EX GEL
2.0000 g | Freq: Four times a day (QID) | CUTANEOUS | Status: DC | PRN
  Administered 2024-10-09 – 2024-10-11 (×4): 2 g via TOPICAL
  Filled 2024-10-09: qty 100

## 2024-10-09 MED ORDER — POTASSIUM CHLORIDE CRYS ER 20 MEQ PO TBCR
40.0000 meq | EXTENDED_RELEASE_TABLET | Freq: Once | ORAL | Status: AC
  Administered 2024-10-09: 40 meq via ORAL
  Filled 2024-10-09: qty 2

## 2024-10-09 NOTE — Progress Notes (Signed)
" ° ° ° °  Subjective:  Patient reports pain as mild.  No issues overnight.  Eager to mobilize with physical therapy.  Asking about ordering breakfast this morning.  Yesterday's total administered Morphine Milligram Equivalents: 82.5   Objective:   VITALS:   Vitals:   10/08/24 2040 10/08/24 2245 10/09/24 0154 10/09/24 0625  BP: (!) 151/69 (!) 149/62 139/62 (!) 130/56  Pulse: 84 76 72 70  Resp: 16 16 16 16   Temp: 98.1 F (36.7 C) 97.8 F (36.6 C) 98.7 F (37.1 C) 97.8 F (36.6 C)  TempSrc: Oral Oral Oral Oral  SpO2: 98% 97% 97% 98%  Weight:      Height:        Sensation intact distally Intact pulses distally Dorsiflexion/Plantar flexion intact Incision: dressing C/D/I Compartment soft    Lab Results  Component Value Date   WBC 6.2 10/09/2024   HGB 10.1 (L) 10/09/2024   HCT 29.0 (L) 10/09/2024   MCV 88.7 10/09/2024   PLT 237 10/09/2024   BMET    Component Value Date/Time   NA 140 10/08/2024 0539   K 3.1 (L) 10/08/2024 0539   CL 102 10/08/2024 0539   CO2 26 10/08/2024 0539   GLUCOSE 79 10/08/2024 0539   BUN 20 10/08/2024 0539   CREATININE 1.23 (H) 10/08/2024 0539   CALCIUM 9.6 10/08/2024 0539   GFRNONAA 44 (L) 10/08/2024 0539      Xray: Interval cannulated screw fixation with no adverse features.  Assessment/Plan: 1 Day Post-Op   Principal Problem:   Fracture of femoral neck, right, closed (HCC) Active Problems:   Essential hypertension   Vertigo   CKD stage 3a, GFR 45-59 ml/min (HCC)   Iron deficiency anemia   History of CVA (cerebrovascular accident)   Fall  Status post right hip pinning 10/08/2024  Post op recs: WB: WBAT RLE Abx: ancef x23 hours post op Imaging: PACU xrays Dressing: keep intact until follow up, change PRN if soiled or saturated. DVT prophylaxis: Aspirin 81 mg twice daily Follow up: 2 weeks after surgery for a wound check with Dr. Edna at [facility].  Address: [address], [place], [place]  [address]  Office Phone: [phone]      TORIBIO DELENA EDNA 10/09/2024, 6:44 AM   Toribio Edna, MD  Contact information:   [phone] 7am-5pm epic message Dr. Edna, or call office for patient follow up: [phone] After hours and holidays please check [email or web address] for group call information for [facility]   "

## 2024-10-09 NOTE — Evaluation (Signed)
 Occupational Therapy Evaluation Patient Details Name: Chelsey Haas MRN: [id number] DOB: [birth year] Today's Date: 10/09/2024   History of Present Illness   84 y.o. female who presented to the ED for evaluation of right knee pain after a fall at home. Admitted with R FNF, ortho consulted and pt is now  s/p ORIF R hip.  PMH significant for HTN, CVA, HLD, GERD, and vertigo     Clinical Impressions Pt admitted with the above concerns. Pt currently with functional limitations due to the deficits listed below (see OT Problem List). Prior to admit, pt was living at home with her husband, independent with all ADL's and functional mobility. Pt will benefit from acute skilled OT to increase their safety and independence with ADL and functional mobility for ADL to facilitate discharge. Pt currently experiencing increased difficulty with LB ADL's and functional mobility d/t pain level. Pt prefers to discharge home. Would benefit from home health OT services and BSC.   Patient is confined to a single room and not able to walk the distance required to go the bathroom, or he/she is unable to safely negotiate stairs required to access the bathroom.  A 3in1 BSC will alleviate this problem       If plan is discharge home, recommend the following:   A little help with walking and/or transfers;A lot of help with bathing/dressing/bathroom;Assistance with cooking/housework;Help with stairs or ramp for entrance;Assist for transportation     Functional Status Assessment   Patient has had a recent decline in their functional status and demonstrates the ability to make significant improvements in function in a reasonable and predictable amount of time.     Equipment Recommendations   BSC/3in1      Precautions/Restrictions   Precautions Precautions: Fall Restrictions Weight Bearing Restrictions Per Provider Order: Yes RLE Weight Bearing Per Provider Order: Weight bearing as tolerated      Mobility Bed Mobility Overal bed mobility: Needs Assistance Bed Mobility: Supine to Sit     Supine to sit: Contact guard, Used rails, HOB elevated   Transfers Overall transfer level: Needs assistance Equipment used: Rolling walker (2 wheels) Transfers: Sit to/from Stand, Bed to chair/wheelchair/BSC Sit to Stand: Contact guard assist     Step pivot transfers: Contact guard assist     General transfer comment: VC for hand placement during RW management prior to sit to stand transition      Balance Overall balance assessment: Needs assistance, History of Falls Sitting-balance support: No upper extremity supported, Feet supported Sitting balance-Leahy Scale: Fair     Standing balance support: Bilateral upper extremity supported, During functional activity, Reliant on assistive device for balance Standing balance-Leahy Scale: Poor       ADL either performed or assessed with clinical judgement   ADL Overall ADL's : Needs assistance/impaired     Grooming: Wash/dry hands;Wash/dry face;Set up   Upper Body Bathing: Set up;Sitting   Lower Body Bathing: Total assistance;Sitting/lateral leans   Upper Body Dressing : Set up;Sitting   Lower Body Dressing: Total assistance;Sitting/lateral leans   Toilet Transfer: Minimal assistance;Ambulation;Regular Toilet;Grab bars;Rolling walker (2 wheels)   Toileting- Clothing Manipulation and Hygiene: Total assistance;Sit to/from stand       Functional mobility during ADLs: Contact guard assist;Rolling walker (2 wheels)       Vision Baseline Vision/History: 1 Wears glasses Ability to See in Adequate Light: 0 Adequate Patient Visual Report: No change from baseline Vision Assessment?: No apparent visual deficits     Perception Perception: Within Functional Limits  Praxis Praxis: [facility]       Pertinent Vitals/Pain Pain Assessment Pain Assessment: 0-10 Pain Score: 6  (at rest 0/10 6/10 right knee once up and  standing/walking) Pain Location: right knee with activity Pain Descriptors / Indicators: Aching, Discomfort, Grimacing Pain Intervention(s): Limited activity within patient's tolerance, Monitored during session, Repositioned, Patient requesting pain meds-RN notified     Extremity/Trunk Assessment Upper Extremity Assessment Upper Extremity Assessment: Overall WFL for tasks assessed   Lower Extremity Assessment Lower Extremity Assessment: Defer to PT evaluation    Cervical / Trunk Assessment Cervical / Trunk Assessment: Normal   Communication Communication Communication: No apparent difficulties   Cognition Arousal: Alert Behavior During Therapy: WFL for tasks assessed/performed Cognition: No apparent impairments     Following commands: Intact       Cueing  General Comments   Cueing Techniques: Verbal cues  VSS on RA   Exercises Exercises: General Lower Extremity General Exercises - Lower Extremity Ankle Circles/Pumps: AROM, Both, 10 reps, Supine Heel Slides: AROM, Both, 10 reps, Supine Straight Leg Raises: AROM, Both, 10 reps, Supine        Home Living Family/patient expects to be discharged to:: Private residence Living Arrangements: Spouse/significant other (Husband is unable to provide physical assistance.) Available Help at Discharge: Family;Available 24 hours/day (Daughter is available until 1/8. Step son is arriving today 1/1. Family making a plan assistance.) Type of Home: House Home Access: Stairs to enter [employer] of Steps: 3 Entrance Stairs-Rails: Right;Left;Can reach both Home Layout: One level     Bathroom Shower/Tub: Producer, Television/film/video: Standard     Home Equipment: Toilet riser;Tub bench;Hand held shower head;Grab bars - tub/shower         Prior Functioning/Environment Prior Level of Function : Driving;Independent/Modified Independent;History of Falls (last six months)  Mobility Comments: indepependent, hx of   falls      OT Problem List: Decreased activity tolerance;Impaired balance (sitting and/or standing);Pain   OT Treatment/Interventions: Self-care/ADL training;Therapeutic exercise;Energy conservation;DME and/or AE instruction;Therapeutic activities;Patient/family education;Balance training      OT Goals(Current goals can be found in the care plan section)   Acute Rehab OT Goals Patient Stated Goal: to use the bathroom OT Goal Formulation: With patient Time For Goal Achievement: 10/23/24 Potential to Achieve Goals: Good   OT Frequency:  Min 2X/week       AM-PAC OT 6 Clicks Daily Activity     Outcome Measure Help from another person eating meals?: None Help from another person taking care of personal grooming?: None Help from another person toileting, which includes using toliet, bedpan, or urinal?: Total Help from another person bathing (including washing, rinsing, drying)?: A Lot Help from another person to put on and taking off regular upper body clothing?: A Little Help from another person to put on and taking off regular lower body clothing?: Total 6 Click Score: 15   End of Session Equipment Utilized During Treatment: Rolling walker (2 wheels);Gait belt Nurse Communication: Mobility status;Patient requests pain meds  Activity Tolerance: Patient tolerated treatment well;Patient limited by pain Patient left: in chair;with call bell/phone within reach;with chair alarm set;with nursing/sitter in room  OT Visit Diagnosis: Unsteadiness on feet (R26.81);Muscle weakness (generalized) (M62.81);Pain;History of falling (Z91.81) Pain - Right/Left: Right Pain - part of body: Knee                Time: 0913-1006 OT Time Calculation (min): 53 min Charges:  OT General Charges $OT Visit: 1 Visit OT Evaluation $OT Eval Moderate Complexity:  1 Mod OT Treatments $Self Care/Home Management : 23-37 mins  Leita Howell, OTR/L,CBIS  Supplemental OT - [facility] Secure Chat  Preferred    Almer Bushey, Leita BIRCH 10/09/2024, 1:27 PM

## 2024-10-09 NOTE — Evaluation (Signed)
 Physical Therapy Evaluation Patient Details Name: Chelsey Haas MRN: [id number] DOB: 1941/02/09 Today's Date: 10/09/2024  History of Present Illness  84 y.o. female who presented to the ED for evaluation of right knee pain after a fall at home. Admitted with R FNF, ortho consulted and pt is now  s/p ORIF R hip.  PMH significant for HTN, CVA, HLD, GERD, and vertigo  Clinical Impression  Pt admitted with above diagnosis.  Pt is independent at baseline, assists in care of her spouse who is on hospice. Ha sdtr staying until  1/8 and husband's son coming to stay and assist also. Pt amb ~ 4' with RW and CGA to supervision today. Will continue to follow. Recommend HHPT at d/c   Pt currently with functional limitations due to the deficits listed below (see PT Problem List). Pt will benefit from acute skilled PT to increase their independence and safety with mobility to allow discharge.           If plan is discharge home, recommend the following: A little help with walking and/or transfers;A little help with bathing/dressing/bathroom;Help with stairs or ramp for entrance;Assist for transportation;Assistance with cooking/housework   Can travel by private vehicle        Equipment Recommendations Rolling walker (2 wheels);BSC/3in1  Recommendations for Other Services       Functional Status Assessment Patient has had a recent decline in their functional status and demonstrates the ability to make significant improvements in function in a reasonable and predictable amount of time.     Precautions / Restrictions Precautions Precautions: Fall Restrictions Weight Bearing Restrictions Per Provider Order: No RLE Weight Bearing Per Provider Order: Weight bearing as tolerated      Mobility  Bed Mobility               General bed mobility comments: OOB with OT    Transfers Overall transfer level: Needs assistance Equipment used: Rolling walker (2 wheels) Transfers: Sit to/from  Stand Sit to Stand: Contact guard assist           General transfer comment: cues for hand placement. CGA for safety    Ambulation/Gait Ambulation/Gait assistance: Contact guard assist, Supervision Gait Distance (Feet): 50 Feet Assistive device: Rolling walker (2 wheels) Gait Pattern/deviations: Step-to pattern, Decreased stance time - right Gait velocity: decr     General Gait Details: verbal cues for sequence, RW position, trunk extension. CGA for safety, no overt LOB  Stairs            Wheelchair Mobility     Tilt Bed    Modified Rankin (Stroke Patients Only)       Balance Overall balance assessment: Needs assistance, History of Falls         Standing balance support: Reliant on assistive device for balance, During functional activity Standing balance-Leahy Scale: Poor Standing balance comment: reliant on device for static standing                             Pertinent Vitals/Pain Pain Assessment Pain Score: 6  Pain Location: right knee with activity (decr after ambulating to 3/10) Pain Descriptors / Indicators: Aching, Discomfort, Grimacing Pain Intervention(s): Limited activity within patient's tolerance, Monitored during session, Premedicated before session, Repositioned, Other (comment) (requesting voltaren for R knee)    Home Living Family/patient expects to be discharged to:: Private residence Living Arrangements: Spouse/significant other Available Help at Discharge: Family;Available 24 hours/day Type of Home: House Home Access:  Stairs to enter Entrance Stairs-Rails: Right;Left;Can reach both Entrance Stairs-Number of Steps: 3   Home Layout: One level Home Equipment: Toilet riser;Tub bench;Hand held shower head;Grab bars - tub/shower Additional Comments: dtr staying until 10/17/23,  husband's son coming to stay and assist as needed  also    Prior Function Prior Level of Function : Driving;Independent/Modified Independent;History  of Falls (last six months)             Mobility Comments: indepependent, hx of  falls       Extremity/Trunk Assessment   Upper Extremity Assessment Upper Extremity Assessment: Defer to OT evaluation;Right hand dominant    Lower Extremity Assessment Lower Extremity Assessment: RLE deficits/detail RLE Deficits / Details: ankle WFL, knee extension and hip flexion 2+/5 to 3/5-not fully tested d/t pain       Communication   Communication Communication: No apparent difficulties    Cognition Arousal: Alert Behavior During Therapy: WFL for tasks assessed/performed   PT - Cognitive impairments: No apparent impairments                         Following commands: Intact       Cueing Cueing Techniques: Verbal cues     General Comments      Exercises     Assessment/Plan    PT Assessment Patient needs continued PT services  PT Problem List Decreased strength;Decreased mobility;Decreased activity tolerance;Decreased knowledge of use of DME;Decreased balance;Pain       PT Treatment Interventions DME instruction;Therapeutic exercise;Gait training;Functional mobility training;Therapeutic activities;Patient/family education;Stair training;Balance training    PT Goals (Current goals can be found in the Care Plan section)  Acute Rehab PT Goals Patient Stated Goal: home if possible PT Goal Formulation: With patient Time For Goal Achievement: 10/23/24 Potential to Achieve Goals: Good    Frequency Min 4X/week     Co-evaluation               AM-PAC PT 6 Clicks Mobility  Outcome Measure Help needed turning from your back to your side while in a flat bed without using bedrails?: A Little Help needed moving from lying on your back to sitting on the side of a flat bed without using bedrails?: A Little Help needed moving to and from a bed to a chair (including a wheelchair)?: A Little Help needed standing up from a chair using your arms (e.g., wheelchair  or bedside chair)?: A Little Help needed to walk in hospital room?: A Little Help needed climbing 3-5 steps with a railing? : A Lot 6 Click Score: 17    End of Session Equipment Utilized During Treatment: Gait belt Activity Tolerance: Patient tolerated treatment well Patient left: with call bell/phone within reach;in chair;with chair alarm set   PT Visit Diagnosis: Other abnormalities of gait and mobility (R26.89)    Time: 1040-1059 PT Time Calculation (min) (ACUTE ONLY): 19 min   Charges:   PT Evaluation $PT Eval Low Complexity: 1 Low   PT General Charges $$ ACUTE PT VISIT: 1 Visit         Tukker Byrns, PT  Acute Rehab Dept ([facility]) [phone]  10/09/2024   [facility] 10/09/2024, 11:12 AM

## 2024-10-09 NOTE — Progress Notes (Signed)
 " PROGRESS NOTE    Chelsey Haas  FMW:[id number] DOB: 02/07/1941 DOA: 10/07/2024 PCP: Pcp, No    Brief Narrative:   Chelsey Haas is a 84 y.o. female with past medical history significant for HTN, HLD, CVA, GERD, vertigo who presented to [facility] ED on 10/07/2024 from home via EMS with right knee pain following mechanical fall at home.  Patient reports history of vertigo, usually occurring once/twice per week. When she woke up this morning she experienced some mild vertigo described as the room spinning.  As she was walking, she attempted to stabilize herself by holding onto her dresser however she was unable to hold on completely and fell on her right side. She was unable to get up due to pain in her right knee so EMS was called. She denies hitting her head or LOC. She endorses pain around her right knee and hip with movement but currently denies any pain in the area after receiving pain medication.  He has not had any recent illnesses or reports she has lost a significant amount of weight over the last 2 years due to grieving multiple siblings that passed away.   In the ED, temperature 98.3 F, HR 70, RR 16, BP 110/56, SpO2 98% room air.  WBC 10.2, hemoglobin 11.9, platelet count 253.  Sodium 140, potassium 3.7, chloride 102, CO2 26, glucose 106, BUN 19, creatinine 1.28.  CT head/C-spine with no acute intracranial normality, no evidence of acute traumatic injury.  CT of right hip with acute mildly impacted, anatomically aligned subcapital right femoral neck fracture without dislocation, remote healed fracture right superior and inferior pubic rami, severe sigmoid diverticulosis.  Orthopedics consulted.  TRH consulted for admission for further evaluation and management of right femoral neck fracture.  Assessment & Plan:   Acute impacted right femoral neck fracture Patient presenting to ED follow mechanical fall at home with associated right knee pain.  CT right hip with acute mildly impacted  anatomically aligned subcapital right femoral neck fracture without dislocation.  Orthopedics was consulted and patient underwent ORIF with screw fixation by Dr. Edna on 10/08/2024.  -- Orthopedics, Dr. Edna following; appreciate assistance -- WBAT RLE -- Tylenol  650 as needed mild pain, fever -- Oxycodone 5 g p.o. every 6 hours as needed moderate pain -- ASA 81 mg PO BID for postoperative DVT prophylaxis -- PT recommending home health; rolling walker, 3N1 bedside commode -- OT evaluation: Pending -- TOC consulted -- Outpatient follow-up with orthopedics 2 weeks for wound check  Hypokalemia Repleted.  Potassium 3.6 this morning.  HTN -- Hydrochlorothiazide  25 mg p.o. daily -- Lisinopril  20 g p.o. daily  HLD -- Simvastatin  40 mams p.o. daily  GERD -- Protonix  40 mg p.o. daily  Depression -- Sertraline  50 mg p.o. nightly  Vertigo Takes meclizine  as needed at home.   DVT prophylaxis: SCDs Start: 10/07/24 2231    Code Status: Full Code Family Communication: No family present at bedside this morning  Disposition Plan:  Level of care: Med-Surg Status is: Inpatient Remains inpatient appropriate because: Pending OT evaluation    Consultants:  Orthopedics, Dr. Edna  Procedures:  None  Antimicrobials:  None   Subjective: Patient seen examined bedside, lying in bed.  Complaining of mild leg pain.  Seen by orthopedics this morning.  PT currently recommending home health, walker and 3 N 1 bedside commode.  Awaiting OT evaluation.  No other questions or concerns at this time. Denies headache, no dizziness, no chest pain, no palpitations, no shortness of breath,  no abdominal pain, no fever/chills/night sweats, no nausea/vomiting/diarrhea, no focal weakness, no fatigue, no paresthesia.  No acute events overnight per nurse staff.  Objective: Vitals:   10/08/24 2245 10/09/24 0154 10/09/24 0625 10/09/24 0921  BP: (!) 149/62 139/62 (!) 130/56 (!) 123/52   Pulse: 76 72 70 81  Resp: 16 16 16 18   Temp: 97.8 F (36.6 C) 98.7 F (37.1 C) 97.8 F (36.6 C) 97.8 F (36.6 C)  TempSrc: Oral Oral Oral Oral  SpO2: 97% 97% 98% 97%  Weight:      Height:        Intake/Output Summary (Last 24 hours) at 10/09/2024 1302 Last data filed at 10/09/2024 1000 Gross per 24 hour  Intake 1525 ml  Output 1300 ml  Net 225 ml   Filed Weights   10/08/24 1433  Weight: 61.2 kg    Examination:  Physical Exam: GEN: NAD, alert and oriented x 3, wd/wn HEENT: NCAT, PERRL, EOMI, sclera clear, MMM PULM: CTAB w/o wheezes/crackles, normal respiratory effort, on room air with SpO2 99% at rest CV: RRR w/o M/G/R GI: abd soft, NTND, NABS, no R/G/M MSK: no peripheral edema, right hip surgical incision site with dressing in place, clean/dry/intact  NEURO: No focal neurological deficit PSYCH: normal mood/affect Integumentary: dry/intact, no rashes or wounds    Data Reviewed: I have personally reviewed following labs and imaging studies  CBC: Recent Labs  Lab 10/07/24 1800 10/08/24 0539 10/09/24 0506  WBC 10.2 5.5 6.2  NEUTROABS 9.1*  --   --   HGB 11.9* 9.7* 10.1*  HCT 36.8 29.5* 29.0*  MCV 91.5 89.7 88.7  PLT 253 217 237   Basic Metabolic Panel: Recent Labs  Lab 10/07/24 1800 10/08/24 0539 10/09/24 0506  NA 140 140 136  K 3.7 3.1* 3.6  CL 102 102 97*  CO2 26 26 24   GLUCOSE 106* 79 99  BUN 19 20 19   CREATININE 1.28* 1.23* 1.30*  CALCIUM 10.1 9.6 9.5  MG  --   --  1.8   GFR: Estimated Creatinine Clearance: 28.1 mL/min (A) (by C-G formula based on SCr of 1.3 mg/dL (H)). Liver Function Tests: No results for input(s): AST, ALT, ALKPHOS, BILITOT, PROT, ALBUMIN in the last 168 hours. No results for input(s): LIPASE, AMYLASE in the last 168 hours. No results for input(s): AMMONIA in the last 168 hours. Coagulation Profile: No results for input(s): INR, PROTIME in the last 168 hours. Cardiac Enzymes: No results for  input(s): CKTOTAL, CKMB, CKMBINDEX, TROPONINI in the last 168 hours. BNP (last 3 results) No results for input(s): PROBNP in the last 8760 hours. HbA1C: No results for input(s): HGBA1C in the last 72 hours. CBG: No results for input(s): GLUCAP in the last 168 hours. Lipid Profile: No results for input(s): CHOL, HDL, LDLCALC, TRIG, CHOLHDL, LDLDIRECT in the last 72 hours. Thyroid Function Tests: No results for input(s): TSH, T4TOTAL, FREET4, T3FREE, THYROIDAB in the last 72 hours. Anemia Panel: No results for input(s): VITAMINB12, FOLATE, FERRITIN, TIBC, IRON, RETICCTPCT in the last 72 hours. Sepsis Labs: No results for input(s): PROCALCITON, LATICACIDVEN in the last 168 hours.  No results found for this or any previous visit (from the past 240 hours).       Radiology Studies: DG HIP UNILAT W OR W/O PELVIS 2-3 VIEWS RIGHT Result Date: 10/08/2024 CLINICAL DATA:  Fracture, postop. EXAM: DG HIP (WITH OR WITHOUT PELVIS) 2-3V RIGHT COMPARISON:  Preoperative imaging FINDINGS: Three screws traverse the right femoral neck. Stable alignment of femoral neck  fracture. Remote right pubic rami fracture. Recent postsurgical change includes air and edema in the soft tissues. IMPRESSION: ORIF of right femoral neck fracture. No immediate postoperative complication. Electronically Signed   By: Andrea Gasman M.D.   On: 10/08/2024 16:46   DG FEMUR, MIN 2 VIEWS RIGHT Result Date: 10/08/2024 CLINICAL DATA:  Elective surgery. EXAM: RIGHT FEMUR 2 VIEWS COMPARISON:  Preoperative imaging FINDINGS: Twelve fluoroscopic spot views of the right proximal femur submitted from the operating room. Three screws traverse femoral neck fracture. Fluoroscopy time 39 seconds. Dose 10.02 mGy. IMPRESSION: Intraoperative fluoroscopy during right femoral neck fracture fixation. Electronically Signed   By: Andrea Gasman M.D.   On: 10/08/2024 16:35   DG C-Arm 1-60 Min-No  Report Result Date: 10/08/2024 Fluoroscopy was utilized by the requesting physician.  No radiographic interpretation.   CT HIP RIGHT WO CONTRAST Result Date: 10/07/2024 EXAM: CT OF THE RIGHT HIP WITHOUT IV CONTRAST 10/07/2024 09:53:47 PM TECHNIQUE: CT of the right hip was performed without the administration of intravenous contrast. Multiplanar reformatted images are provided for review. Automated exposure control, iterative reconstruction, and/or weight based adjustment of the mA/kV was utilized to reduce the radiation dose to as low as reasonably achievable. COMPARISON: None available. CLINICAL HISTORY: Fracture, hip FINDINGS: BONES: There is an acute, mildly impacted anatomically aligned subcapital right femoral neck fracture. No dislocation. Remote healed fracture of the right superior and inferior pubic rami. No aggressive appearing osseous abnormality or periostitis. SOFT TISSUE: No significant soft tissue edema or fluid collections. No soft tissue mass. 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 No osseous erosions. INTRAPELVIC CONTENTS: Severe sigmoid diverticulosis incidentally noted. IMPRESSION: 1. Acute, mildly impacted, anatomically aligned subcapital right femoral neck fracture. No dislocation. 2. Remote healed fracture of the right superior and inferior pubic rami. 3. Severe sigmoid diverticulosis. Electronically signed by: Dorethia Molt MD 10/07/2024 11:57 PM EST RP Workstation: [id number]   DG Hip Unilat W or Wo Pelvis 2-3 Views Right Result Date: 10/07/2024 EXAM: 2 OR MORE VIEW(S) XRAY OF THE RIGHT HIP 10/07/2024 08:26:29 PM COMPARISON: None available. CLINICAL HISTORY: fall, fx on femur 1v FINDINGS: BONES AND JOINTS: Nondisplaced right femoral neck fracture. Old healed right superior and inferior pubic rami fractures. No malalignment. SOFT TISSUES: Surgical clips overlie the left pelvis. LINES AND TUBES: IVC filter noted. IMPRESSION: 1. Nondisplaced right  femoral neck fracture. 2. Old healed right superior and inferior pubic rami fractures. Electronically signed by: Greig Pique MD 10/07/2024 09:43 PM EST RP Workstation: [id number]   DG Femur 1V Right Result Date: 10/07/2024 EXAM: _Views_ VIEW(S) XRAY OF THE RIGHT FEMUR 10/07/2024 06:27:00 PM COMPARISON: 06/21/2023 CLINICAL HISTORY: post fall FINDINGS: BONES AND JOINTS: Healed right superior and inferior pubic rami fractures. New nondisplaced right femoral neck fracture. Degenerative changes of the right knee. No malalignment. SOFT TISSUES: The soft tissues are unremarkable. IMPRESSION: 1. New nondisplaced right femoral neck fracture. 2. Healed right superior and inferior pubic rami fractures. 3. Degenerative changes of the right knee. Electronically signed by: Greig Pique MD 10/07/2024 07:52 PM EST RP Workstation: [id number]   DG Tibia/Fibula Right Result Date: 10/07/2024 EXAM: 3 VIEW(S) XRAY OF THE LATERALITY TIBIA AND FIBULA 10/07/2024 06:27:00 PM COMPARISON: None available. CLINICAL HISTORY: post fall FINDINGS: BONES AND JOINTS: No acute fracture. No malalignment. SOFT TISSUES: The soft tissues are unremarkable. IMPRESSION: 1. No acute fracture or dislocation. Electronically signed by: Greig Pique MD 10/07/2024 07:51 PM EST RP Workstation: [id number]   DG Knee Complete 4 Views Right Result Date:  10/07/2024 EXAM: 4 VIEW(S) XRAY OF THE KNEE 10/07/2024 06:27:00 PM COMPARISON: None available. CLINICAL HISTORY: knee pain post fall FINDINGS: BONES AND JOINTS: No acute fracture. No malalignment. No significant joint effusion. 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 Mild osteoarthritis of the patellofemoral compartment. SOFT TISSUES: The soft tissues are unremarkable. IMPRESSION: 1. No acute fracture or dislocation. 2. Moderate tricompartmental osteoarthritis, most pronounced within the lateral compartment. Electronically signed by: Greig Pique MD 10/07/2024  07:47 PM EST RP Workstation: [id number]   CT Cervical Spine Wo Contrast Result Date: 10/07/2024 EXAM: CT CERVICAL SPINE WITHOUT CONTRAST 10/07/2024 06:17:47 PM TECHNIQUE: CT of the cervical spine was performed without the administration of intravenous contrast. Multiplanar reformatted images are provided for review. Automated exposure control, iterative reconstruction, and/or weight based adjustment of the mA/kV was utilized to reduce the radiation dose to as low as reasonably achievable. COMPARISON: 06/21/2023 CLINICAL HISTORY: Neck trauma (Age >= 65y) FINDINGS: BONES AND ALIGNMENT: Trace degenerative retrolisthesis of C4 on C5. No evidence of traumatic malalignment. No compression fracture or displaced fracture in the cervical spine. Diffuse osteopenia. DEGENERATIVE CHANGES: Disc space narrowing most pronounced at C4-C5 and C5-C6 with associated degenerative endplate changes. Facet arthrosis and uncovertebral hypertrophy at multiple levels. Foraminal stenosis most pronounced on the right at C4-C5 and C5-C6. No high grade osseous spinal canal stenosis. SOFT TISSUES: No prevertebral soft tissue swelling. IMPRESSION: 1. No evidence of acute traumatic injury. Electronically signed by: Donnice Mania MD 10/07/2024 07:15 PM EST RP Workstation: [id number]   CT Head Wo Contrast Result Date: 10/07/2024 EXAM: CT HEAD WITHOUT CONTRAST 10/07/2024 06:17:47 PM TECHNIQUE: CT of the head was performed without the administration of intravenous contrast. Automated exposure control, iterative reconstruction, and/or weight based adjustment of the mA/kV was utilized to reduce the radiation dose to as low as reasonably achievable. COMPARISON: 06/21/2023 CLINICAL HISTORY: Head trauma, minor (Age >= 65y) FINDINGS: BRAIN AND VENTRICLES: No acute hemorrhage. No evidence of acute infarct. 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 Additional small remote infarct in the right cerebellum is unchanged. Ex vacuo dilatation of left lateral ventricle. No hydrocephalus. No extra-axial collection. No mass effect or midline shift. ORBITS: No acute abnormality. SINUSES: No acute abnormality. SOFT TISSUES AND SKULL: No acute soft tissue abnormality. No skull fracture. IMPRESSION: 1. No acute intracranial abnormality. Electronically signed by: Donnice Mania MD 10/07/2024 07:07 PM EST RP Workstation: [id number]        Scheduled Meds:  aspirin EC  81 mg Oral BID   ferrous sulfate  325 mg Oral Q breakfast   lisinopril   20 mg Oral Daily   And   hydrochlorothiazide   25 mg Oral Daily   lidocaine   1 patch Transdermal Q24H   pantoprazole   40 mg Oral QHS   sertraline   50 mg Oral QHS   simvastatin   40 mg Oral QHS   Continuous Infusions:     LOS: 2 days    Time spent: 48 minutes spent on 10/09/2024 caring for this patient face-to-face including chart review, ordering labs/tests, documenting, discussion with nursing staff, consultants, updating family and interview/physical exam    Camellia PARAS Henya Aguallo, DO [facility] Available via Epic secure chat 7am-7pm After these hours, please refer to coverage provider listed on [email or web address] 10/09/2024, 1:02 PM   "

## 2024-10-10 DIAGNOSIS — S72001A Fracture of unspecified part of neck of right femur, initial encounter for closed fracture: Secondary | ICD-10-CM | POA: Diagnosis not present

## 2024-10-10 NOTE — Progress Notes (Signed)
 " PROGRESS NOTE    Chelsey Haas  FMW:[id number] DOB: [birth year] DOA: 10/07/2024 PCP: Pcp, No    Brief Narrative:   Chelsey Haas is a 84 y.o. female with past medical history significant for HTN, HLD, CVA, GERD, vertigo who presented to [facility] ED on 10/07/2024 from home via EMS with right knee pain following mechanical fall at home.  Patient reports history of vertigo, usually occurring once/twice per week. When she woke up this morning she experienced some mild vertigo described as the room spinning.  As she was walking, she attempted to stabilize herself by holding onto her dresser however she was unable to hold on completely and fell on her right side. She was unable to get up due to pain in her right knee so EMS was called. She denies hitting her head or LOC. She endorses pain around her right knee and hip with movement but currently denies any pain in the area after receiving pain medication.  He has not had any recent illnesses or reports she has lost a significant amount of weight over the last 2 years due to grieving multiple siblings that passed away.   In the ED, temperature 98.3 F, HR 70, RR 16, BP 110/56, SpO2 98% room air.  WBC 10.2, hemoglobin 11.9, platelet count 253.  Sodium 140, potassium 3.7, chloride 102, CO2 26, glucose 106, BUN 19, creatinine 1.28.  CT head/C-spine with no acute intracranial normality, no evidence of acute traumatic injury.  CT of right hip with acute mildly impacted, anatomically aligned subcapital right femoral neck fracture without dislocation, remote healed fracture right superior and inferior pubic rami, severe sigmoid diverticulosis.  Orthopedics consulted.  TRH consulted for admission for further evaluation and management of right femoral neck fracture.  Assessment & Plan:   Acute impacted right femoral neck fracture Patient presenting to ED follow mechanical fall at home with associated right knee pain.  CT right hip with acute mildly impacted  anatomically aligned subcapital right femoral neck fracture without dislocation.  Orthopedics was consulted and patient underwent ORIF with screw fixation by Dr. Edna on 10/08/2024.  -- Orthopedics, Dr. Edna following; appreciate assistance -- WBAT RLE -- Tylenol  650 as needed mild pain, fever -- Oxycodone 5 g p.o. every 6 hours as needed moderate pain -- ASA 81 mg PO BID for postoperative DVT prophylaxis -- PT/OT recommending home health; rolling walker, 3N1 bedside commode -- TOC consulted -- Outpatient follow-up with orthopedics 2 weeks for wound check -- Discussed anticipated discharge tomorrow  Hypokalemia Repleted.    HTN -- Hydrochlorothiazide  25 mg p.o. daily -- Lisinopril  20 g p.o. daily  HLD -- Simvastatin  40 mg p.o. daily  GERD -- Protonix  40 mg p.o. daily  Depression -- Sertraline  50 mg p.o. nightly  Vertigo Takes meclizine  as needed at home.   DVT prophylaxis: SCDs Start: 10/07/24 2231    Code Status: Full Code Family Communication: No family present at bedside this morning  Disposition Plan:  Level of care: Med-Surg Status is: Inpatient Remains inpatient appropriate because: Anticipate discharge home with home health tomorrow    Consultants:  Orthopedics, Dr. Edna  Procedures:  None  Antimicrobials:  None   Subjective: Patient seen examined bedside, lying in bed.  Sleeping, easily arousable.  Pain currently controlled.  Seen by therapy yesterday with recommendation of home health.  Discussed will continue treatment for an additional day today with anticipated discharge home tomorrow.  No other questions or concerns at this time. Denies headache, no dizziness, no chest pain,  no palpitations, no shortness of breath, no abdominal pain, no fever/chills/night sweats, no nausea/vomiting/diarrhea, no focal weakness, no fatigue, no paresthesia.  No acute events overnight per nurse staff.  Objective: Vitals:   10/09/24 0921 10/09/24 1324  10/09/24 2057 10/10/24 0539  BP: (!) 123/52 (!) 129/54 (!) 116/55 (!) 114/50  Pulse: 81 77 81 74  Resp: 18 16 15 14   Temp: 97.8 F (36.6 C) 98 F (36.7 C) 98.2 F (36.8 C) 97.8 F (36.6 C)  TempSrc: Oral  Oral Oral  SpO2: 97% 100% 99% 97%  Weight:      Height:        Intake/Output Summary (Last 24 hours) at 10/10/2024 1333 Last data filed at 10/10/2024 1000 Gross per 24 hour  Intake 1080 ml  Output 950 ml  Net 130 ml   Filed Weights   10/08/24 1433  Weight: 61.2 kg    Examination:  Physical Exam: GEN: NAD, alert and oriented x 3, wd/wn HEENT: NCAT, PERRL, EOMI, sclera clear, MMM PULM: CTAB w/o wheezes/crackles, normal respiratory effort, on room air with SpO2 97% at rest CV: RRR w/o M/G/R GI: abd soft, NTND, NABS, no R/G/M MSK: no peripheral edema, right hip surgical incision site with dressing in place, clean/dry/intact  NEURO: No focal neurological deficit PSYCH: normal mood/affect Integumentary: dry/intact, no rashes or wounds    Data Reviewed: I have personally reviewed following labs and imaging studies  CBC: Recent Labs  Lab 10/07/24 1800 10/08/24 0539 10/09/24 0506  WBC 10.2 5.5 6.2  NEUTROABS 9.1*  --   --   HGB 11.9* 9.7* 10.1*  HCT 36.8 29.5* 29.0*  MCV 91.5 89.7 88.7  PLT 253 217 237   Basic Metabolic Panel: Recent Labs  Lab 10/07/24 1800 10/08/24 0539 10/09/24 0506  NA 140 140 136  K 3.7 3.1* 3.6  CL 102 102 97*  CO2 26 26 24   GLUCOSE 106* 79 99  BUN 19 20 19   CREATININE 1.28* 1.23* 1.30*  CALCIUM 10.1 9.6 9.5  MG  --   --  1.8   GFR: Estimated Creatinine Clearance: 28.1 mL/min (A) (by C-G formula based on SCr of 1.3 mg/dL (H)). Liver Function Tests: No results for input(s): AST, ALT, ALKPHOS, BILITOT, PROT, ALBUMIN in the last 168 hours. No results for input(s): LIPASE, AMYLASE in the last 168 hours. No results for input(s): AMMONIA in the last 168 hours. Coagulation Profile: No results for input(s): INR,  PROTIME in the last 168 hours. Cardiac Enzymes: No results for input(s): CKTOTAL, CKMB, CKMBINDEX, TROPONINI in the last 168 hours. BNP (last 3 results) No results for input(s): PROBNP in the last 8760 hours. HbA1C: No results for input(s): HGBA1C in the last 72 hours. CBG: No results for input(s): GLUCAP in the last 168 hours. Lipid Profile: No results for input(s): CHOL, HDL, LDLCALC, TRIG, CHOLHDL, LDLDIRECT in the last 72 hours. Thyroid Function Tests: No results for input(s): TSH, T4TOTAL, FREET4, T3FREE, THYROIDAB in the last 72 hours. Anemia Panel: No results for input(s): VITAMINB12, FOLATE, FERRITIN, TIBC, IRON, RETICCTPCT in the last 72 hours. Sepsis Labs: No results for input(s): PROCALCITON, LATICACIDVEN in the last 168 hours.  No results found for this or any previous visit (from the past 240 hours).       Radiology Studies: DG HIP UNILAT W OR W/O PELVIS 2-3 VIEWS RIGHT Result Date: 10/08/2024 CLINICAL DATA:  Fracture, postop. EXAM: DG HIP (WITH OR WITHOUT PELVIS) 2-3V RIGHT COMPARISON:  Preoperative imaging FINDINGS: Three screws traverse the right  femoral neck. Stable alignment of femoral neck fracture. Remote right pubic rami fracture. Recent postsurgical change includes air and edema in the soft tissues. IMPRESSION: ORIF of right femoral neck fracture. No immediate postoperative complication. Electronically Signed   By: Andrea Gasman M.D.   On: 10/08/2024 16:46   DG FEMUR, MIN 2 VIEWS RIGHT Result Date: 10/08/2024 CLINICAL DATA:  Elective surgery. EXAM: RIGHT FEMUR 2 VIEWS COMPARISON:  Preoperative imaging FINDINGS: Twelve fluoroscopic spot views of the right proximal femur submitted from the operating room. Three screws traverse femoral neck fracture. Fluoroscopy time 39 seconds. Dose 10.02 mGy. IMPRESSION: Intraoperative fluoroscopy during right femoral neck fracture fixation. Electronically Signed   By:  Andrea Gasman M.D.   On: 10/08/2024 16:35   DG C-Arm 1-60 Min-No Report Result Date: 10/08/2024 Fluoroscopy was utilized by the requesting physician.  No radiographic interpretation.        Scheduled Meds:  aspirin EC  81 mg Oral BID   ferrous sulfate  325 mg Oral Q breakfast   lisinopril   20 mg Oral Daily   And   hydrochlorothiazide   25 mg Oral Daily   lidocaine   1 patch Transdermal Q24H   pantoprazole   40 mg Oral QHS   sertraline   50 mg Oral QHS   simvastatin   40 mg Oral QHS   Continuous Infusions:     LOS: 3 days    Time spent: 48 minutes spent on 10/10/2024 caring for this patient face-to-face including chart review, ordering labs/tests, documenting, discussion with nursing staff, consultants, updating family and interview/physical exam    Camellia PARAS Scout Guyett, DO [facility] Available via Epic secure chat 7am-7pm After these hours, please refer to coverage provider listed on [email or web address] 10/10/2024, 1:33 PM   "

## 2024-10-10 NOTE — Progress Notes (Signed)
 Physical Therapy Treatment Patient Details Name: Alaijah Gibler MRN: [id number] DOB: 05-Jun-1941 Today's Date: 10/10/2024   History of Present Illness 84 y.o. female who presented to the ED for evaluation of right knee pain after a fall at home. Admitted with R FNF, ortho consulted and pt is now  s/p ORIF R hip.  PMH significant for HTN, CVA, HLD, GERD, and vertigo    PT Comments   Pt admitted with above diagnosis.  Pt currently with functional limitations due to the deficits listed below (see PT Problem List). Pt seated in recliner when PT arrived. Pt indicated little pain at rest and that she had not taken any pain medication yet today, pain increased to 5/10 with mobility tasks and pt ed provided on trying to stay ahead of the pain with goal of 4/10 or less. Pt verbalized understanding and PT made nurse aware of pt request for pain medication. Pt motivated for increased IND with transfer tasks and CGA to close S with min cues and pt able to push to stand from recliner and from commode, gait tasks CGA and progressing to close S with antalgic pattern and B UE support at RW with slow cadence in personal room 20 feet and in hallway 50. Pt left seated in recliner, all needs in place and pt motivated to d/c 1/3Pt will benefit from acute skilled PT to increase their independence and safety with mobility to allow discharge.      If plan is discharge home, recommend the following: A little help with walking and/or transfers;A little help with bathing/dressing/bathroom;Help with stairs or ramp for entrance;Assist for transportation;Assistance with cooking/housework   Can travel by private Automotive Engineer (2 wheels);BSC/3in1    Recommendations for Other Services       Precautions / Restrictions Precautions Precautions: Fall Recall of Precautions/Restrictions: Intact Restrictions Weight Bearing Restrictions Per Provider Order: Yes RLE Weight Bearing Per  Provider Order: Weight bearing as tolerated     Mobility  Bed Mobility               General bed mobility comments: pt OOB and seated in recliner and returned to recliner at end of session    Transfers Overall transfer level: Needs assistance Equipment used: Rolling walker (2 wheels) Transfers: Sit to/from Stand Sit to Stand: Contact guard assist           General transfer comment: min cues and pt motivated tor increased IND with transfer tasks and close S to CGA for commode and recliner    Ambulation/Gait Ambulation/Gait assistance: Contact guard assist, Supervision Gait Distance (Feet): 50 Feet Assistive device: Rolling walker (2 wheels) Gait Pattern/deviations: Step-to pattern, Decreased stance time - right, Trunk flexed, Antalgic Gait velocity: decreased     General Gait Details: cues for posture, proper distance from RW, RW lowered for increased ease with B UE WB to offload R LE in stance phase   Stairs             Wheelchair Mobility     Tilt Bed    Modified Rankin (Stroke Patients Only)       Balance Overall balance assessment: Needs assistance, History of Falls Sitting-balance support: No upper extremity supported, Feet supported Sitting balance-Leahy Scale: Fair     Standing balance support: Bilateral upper extremity supported, During functional activity, Reliant on assistive device for balance Standing balance-Leahy Scale: Fair Standing balance comment: reliant on device for dynamic balance and  static  standing no UE support                            Communication Communication Communication: No apparent difficulties  Cognition Arousal: Alert Behavior During Therapy: WFL for tasks assessed/performed   PT - Cognitive impairments: No apparent impairments                         Following commands: Intact      Cueing Cueing Techniques: Verbal cues  Exercises General Exercises - Lower Extremity Ankle  Circles/Pumps: AROM, Both, 10 reps, Supine    General Comments        Pertinent Vitals/Pain Pain Assessment Pain Assessment: 0-10 Pain Score: 5  Pain Location: right knee with activity Pain Descriptors / Indicators: Aching, Discomfort, Grimacing Pain Intervention(s): Limited activity within patient's tolerance, Monitored during session, Repositioned, Patient requesting pain meds-RN notified (declined CP)    Home Living                          Prior Function            PT Goals (current goals can now be found in the care plan section) Acute Rehab PT Goals Patient Stated Goal: home if possible PT Goal Formulation: With patient Time For Goal Achievement: 10/23/24 Potential to Achieve Goals: Good Progress towards PT goals: Progressing toward goals    Frequency    Min 5X/week      PT Plan      Co-evaluation              AM-PAC PT 6 Clicks Mobility   Outcome Measure  Help needed turning from your back to your side while in a flat bed without using bedrails?: A Little Help needed moving from lying on your back to sitting on the side of a flat bed without using bedrails?: A Little Help needed moving to and from a bed to a chair (including a wheelchair)?: A Little Help needed standing up from a chair using your arms (e.g., wheelchair or bedside chair)?: A Little Help needed to walk in hospital room?: A Little Help needed climbing 3-5 steps with a railing? : A Lot 6 Click Score: 17    End of Session Equipment Utilized During Treatment: Gait belt Activity Tolerance: Patient limited by pain Patient left: with call bell/phone within reach;in chair;with chair alarm set Nurse Communication: Patient requests pain meds;Mobility status PT Visit Diagnosis: Other abnormalities of gait and mobility (R26.89)     Time: 8757-8679 PT Time Calculation (min) (ACUTE ONLY): 38 min  Charges:    $Gait Training: 8-22 mins $Therapeutic Activity: 23-37 mins PT  General Charges $$ ACUTE PT VISIT: 1 Visit                     [place], PT Acute Rehab    [place] VEAR Drone 10/10/2024, 2:49 PM

## 2024-10-10 NOTE — TOC Transition Note (Signed)
 Transition of Care [facility]) - Discharge Note   Patient Details  Name: Chelsey Haas MRN: [id number] Date of Birth: November 27, 1940  Transition of Care [facility]) CM/SW Contact:  NORMAN ASPEN, LCSW Phone Number: 10/10/2024, 2:55 PM   Clinical Narrative:     Met with pt to review dc recommendations for DME and HH follow up.  Pt aware and agreeable and has no agency preferences.  RW ordered via [employer] for delivery to room prior to dc.  HHPT/OT referral accepted by [facility].  Pt and son aware and agency info added to AVS.  No further IP CM needs.  Final next level of care: Home w Home Health Services Barriers to Discharge: No Barriers Identified   Patient Goals and CMS Choice Patient states their goals for this hospitalization and ongoing recovery are:: return home          Discharge Placement                       Discharge Plan and Services Additional resources added to the After Visit Summary for                  DME Arranged: Walker rolling DME Agency: [employer] Date DME Agency Contacted: 10/10/24   Representative spoke with at DME Agency: 1000 HH Arranged: PT, OT HH Agency: [facility] Date HH Agency Contacted: 10/10/24 Time HH Agency Contacted: 1000 Representative spoke with at [facility] Agency: Burnard Bucks  Social Drivers of Health (SDOH) Interventions SDOH Screenings   Food Insecurity: No Food Insecurity (10/08/2024)  Housing: Unknown (10/08/2024)  Transportation Needs: No Transportation Needs (10/08/2024)  Utilities: Not At Risk (10/08/2024)  Financial Resource Strain: Low Risk  (12/12/2022)   Received from [facility]  Physical Activity: Insufficiently Active (12/12/2022)   Received from [facility]  Social Connections: Socially Integrated (10/08/2024)  Stress: Stress Concern Present (12/12/2022)   Received from [facility]  Tobacco Use: Low Risk (10/08/2024)      Readmission Risk Interventions    10/10/2024    2:48 PM  Readmission Risk Prevention Plan  Post Dischage Appt Complete  Medication Screening Complete  Transportation Screening Complete

## 2024-10-10 NOTE — Anesthesia Postprocedure Evaluation (Signed)
"   Anesthesia Post Note  Patient: Chartered Certified Accountant  Procedure(s) Performed: FIXATION, FEMUR, NECK, PERCUTANEOUS, USING SCREW (Right: Hip)     Patient location during evaluation: PACU Anesthesia Type: General Level of consciousness: awake and alert Pain management: pain level controlled Vital Signs Assessment: post-procedure vital signs reviewed and stable Respiratory status: spontaneous breathing, nonlabored ventilation, respiratory function stable and patient connected to nasal cannula oxygen Cardiovascular status: blood pressure returned to baseline and stable Postop Assessment: no apparent nausea or vomiting Anesthetic complications: no   No notable events documented.              Franky JONETTA Bald      "

## 2024-10-10 NOTE — Progress Notes (Signed)
" ° ° °  2 Days Post-Op Procedures (LRB): FIXATION, FEMUR, NECK, PERCUTANEOUS, USING SCREW (Right)  Subjective:  Patient reports pain as mild.  No issues overnight.  Ambulated 50 ft with PT.  Resting in bed.  Denies chest pain or shortness of breath.  Denies numbness or tingling in leg.  No other concerns today.  Yesterday's total administered Morphine Milligram Equivalents: 15   Objective:   VITALS:   Vitals:   10/09/24 0921 10/09/24 1324 10/09/24 2057 10/10/24 0539  BP: (!) 123/52 (!) 129/54 (!) 116/55 (!) 114/50  Pulse: 81 77 81 74  Resp: 18 16 15 14   Temp: 97.8 F (36.6 C) 98 F (36.7 C) 98.2 F (36.8 C) 97.8 F (36.6 C)  TempSrc: Oral  Oral Oral  SpO2: 97% 100% 99% 97%  Weight:      Height:        Resting in bed in nAD Sensation intact distally Intact pulses distally Dorsiflexion/Plantar flexion intact Incision: dressing C/D/I Compartment soft Wiggles toes appropriately    Lab Results  Component Value Date   WBC 6.2 10/09/2024   HGB 10.1 (L) 10/09/2024   HCT 29.0 (L) 10/09/2024   MCV 88.7 10/09/2024   PLT 237 10/09/2024   BMET    Component Value Date/Time   NA 136 10/09/2024 0506   K 3.6 10/09/2024 0506   CL 97 (L) 10/09/2024 0506   CO2 24 10/09/2024 0506   GLUCOSE 99 10/09/2024 0506   BUN 19 10/09/2024 0506   CREATININE 1.30 (H) 10/09/2024 0506   CALCIUM 9.5 10/09/2024 0506   GFRNONAA 41 (L) 10/09/2024 0506      Xray: Interval cannulated screw fixation with no adverse features.  Assessment/Plan: 2 Days Post-Op   Principal Problem:   Fracture of femoral neck, right, closed (HCC) Active Problems:   Essential hypertension   Vertigo   CKD stage 3a, GFR 45-59 ml/min (HCC)   Iron deficiency anemia   History of CVA (cerebrovascular accident)   Fall  Status post right hip pinning 10/08/2024  Post op recs: WB: WBAT RLE Abx: ancef x23 hours post op Imaging: PACU xrays Dressing: keep intact until follow up, change PRN if soiled or  saturated. DVT prophylaxis: Aspirin 81 mg twice daily Follow up: 2 weeks after surgery for a wound check with Dr. Edna at [facility].  Address: [address], [place], [place] [address]  Office Phone: [phone]      Chelsey Haas 10/10/2024, 7:11 AM    Contact information:   Weekdays 7am-5pm epic message Dr. Edna, or call office for patient follow up: [phone] After hours and holidays please check [email or web address] for group call information for [facility]   "

## 2024-10-11 ENCOUNTER — Encounter (HOSPITAL_COMMUNITY): Payer: Self-pay | Admitting: Orthopedic Surgery

## 2024-10-11 DIAGNOSIS — S72001A Fracture of unspecified part of neck of right femur, initial encounter for closed fracture: Secondary | ICD-10-CM | POA: Diagnosis not present

## 2024-10-11 MED ORDER — DOCUSATE SODIUM 100 MG PO CAPS
100.0000 mg | ORAL_CAPSULE | Freq: Two times a day (BID) | ORAL | Status: DC
  Administered 2024-10-11 – 2024-10-12 (×3): 100 mg via ORAL
  Filled 2024-10-11 (×3): qty 1

## 2024-10-11 NOTE — Progress Notes (Signed)
 " PROGRESS NOTE    Chelsey Haas  FMW:[id number] DOB: 1941/03/16 DOA: 10/07/2024 PCP: Pcp, No    Brief Narrative:   Chelsey Haas is a 84 y.o. female with past medical history significant for HTN, HLD, CVA, GERD, vertigo who presented to [facility] ED on 10/07/2024 from home via EMS with right knee pain following mechanical fall at home.  Patient reports history of vertigo, usually occurring once/twice per week. When she woke up this morning she experienced some mild vertigo described as the room spinning.  As she was walking, she attempted to stabilize herself by holding onto her dresser however she was unable to hold on completely and fell on her right side. She was unable to get up due to pain in her right knee so EMS was called. She denies hitting her head or LOC. She endorses pain around her right knee and hip with movement but currently denies any pain in the area after receiving pain medication.  He has not had any recent illnesses or reports she has lost a significant amount of weight over the last 2 years due to grieving multiple siblings that passed away.   In the ED, temperature 98.3 F, HR 70, RR 16, BP 110/56, SpO2 98% room air.  WBC 10.2, hemoglobin 11.9, platelet count 253.  Sodium 140, potassium 3.7, chloride 102, CO2 26, glucose 106, BUN 19, creatinine 1.28.  CT head/C-spine with no acute intracranial normality, no evidence of acute traumatic injury.  CT of right hip with acute mildly impacted, anatomically aligned subcapital right femoral neck fracture without dislocation, remote healed fracture right superior and inferior pubic rami, severe sigmoid diverticulosis.  Orthopedics consulted.  TRH consulted for admission for further evaluation and management of right femoral neck fracture.  Assessment & Plan:   Acute impacted right femoral neck fracture Patient presenting to ED follow mechanical fall at home with associated right knee pain.  CT right hip with acute mildly impacted  anatomically aligned subcapital right femoral neck fracture without dislocation.  Orthopedics was consulted and patient underwent ORIF with screw fixation by Dr. Edna on 10/08/2024.  -- Orthopedics, Dr. Edna following; appreciate assistance -- WBAT RLE -- Tylenol  650 as needed mild pain, fever -- Oxycodone  5 g p.o. every 6 hours as needed moderate pain -- ASA 81 mg PO BID for postoperative DVT prophylaxis -- PT/OT recommending home health; rolling walker, 3N1 bedside commode -- TOC consulted -- Outpatient follow-up with orthopedics 2 weeks for wound check -- Discussed anticipated discharge tomorrow, Sunday  Hypokalemia Repleted.    HTN -- Hydrochlorothiazide  25 mg p.o. daily -- Lisinopril  20 g p.o. daily  HLD -- Simvastatin  40 mg p.o. daily  GERD -- Protonix  40 mg p.o. daily  Depression -- Sertraline  50 mg p.o. nightly  Vertigo Takes meclizine  as needed at home.   DVT prophylaxis: SCDs Start: 10/07/24 2231    Code Status: Full Code Family Communication: No family present at bedside this morning  Disposition Plan:  Level of care: Med-Surg Status is: Inpatient Remains inpatient appropriate because: Anticipate discharge home with home health tomorrow    Consultants:  Orthopedics, Dr. Edna  Procedures:  None  Antimicrobials:  None   Subjective: Patient seen examined bedside, lying in bed.  No specific complaints.  Discussed with patient regarding discharge planning, she states having other equipment ordered/delivered and will have more help available tomorrow.  Will plan discharge home tomorrow with home health.  Pain currently controlled.  No other questions or concerns at this time. Denies headache,  no dizziness, no chest pain, no palpitations, no shortness of breath, no abdominal pain, no fever/chills/night sweats, no nausea/vomiting/diarrhea, no focal weakness, no fatigue, no paresthesia.  No acute events overnight per nursing  staff.  Objective: Vitals:   10/10/24 0539 10/10/24 1349 10/10/24 2054 10/11/24 0537  BP: (!) 114/50 (!) 102/54 (!) 112/52 (!) 91/49  Pulse: 74 85 82 65  Resp: 14 18 15 15   Temp: 97.8 F (36.6 C) 97.8 F (36.6 C) 98.9 F (37.2 C) 97.9 F (36.6 C)  TempSrc: Oral Oral Oral Oral  SpO2: 97% 97% 99% 95%  Weight:      Height:        Intake/Output Summary (Last 24 hours) at 10/11/2024 1054 Last data filed at 10/11/2024 0900 Gross per 24 hour  Intake 1740 ml  Output --  Net 1740 ml   Filed Weights   10/08/24 1433  Weight: 61.2 kg    Examination:  Physical Exam: GEN: NAD, alert and oriented x 3, wd/wn HEENT: NCAT, PERRL, EOMI, sclera clear, MMM PULM: CTAB w/o wheezes/crackles, normal respiratory effort, on room air with SpO2 97% at rest CV: RRR w/o M/G/R GI: abd soft, NTND, NABS, no R/G/M MSK: no peripheral edema, right hip surgical incision site with dressing in place, clean/dry/intact  NEURO: No focal neurological deficit PSYCH: normal mood/affect Integumentary: dry/intact, no rashes or wounds    Data Reviewed: I have personally reviewed following labs and imaging studies  CBC: Recent Labs  Lab 10/07/24 1800 10/08/24 0539 10/09/24 0506  WBC 10.2 5.5 6.2  NEUTROABS 9.1*  --   --   HGB 11.9* 9.7* 10.1*  HCT 36.8 29.5* 29.0*  MCV 91.5 89.7 88.7  PLT 253 217 237   Basic Metabolic Panel: Recent Labs  Lab 10/07/24 1800 10/08/24 0539 10/09/24 0506  NA 140 140 136  K 3.7 3.1* 3.6  CL 102 102 97*  CO2 26 26 24   GLUCOSE 106* 79 99  BUN 19 20 19   CREATININE 1.28* 1.23* 1.30*  CALCIUM 10.1 9.6 9.5  MG  --   --  1.8   GFR: Estimated Creatinine Clearance: 28.1 mL/min (A) (by C-G formula based on SCr of 1.3 mg/dL (H)). Liver Function Tests: No results for input(s): AST, ALT, ALKPHOS, BILITOT, PROT, ALBUMIN in the last 168 hours. No results for input(s): LIPASE, AMYLASE in the last 168 hours. No results for input(s): AMMONIA in the last 168  hours. Coagulation Profile: No results for input(s): INR, PROTIME in the last 168 hours. Cardiac Enzymes: No results for input(s): CKTOTAL, CKMB, CKMBINDEX, TROPONINI in the last 168 hours. BNP (last 3 results) No results for input(s): PROBNP in the last 8760 hours. HbA1C: No results for input(s): HGBA1C in the last 72 hours. CBG: No results for input(s): GLUCAP in the last 168 hours. Lipid Profile: No results for input(s): CHOL, HDL, LDLCALC, TRIG, CHOLHDL, LDLDIRECT in the last 72 hours. Thyroid Function Tests: No results for input(s): TSH, T4TOTAL, FREET4, T3FREE, THYROIDAB in the last 72 hours. Anemia Panel: No results for input(s): VITAMINB12, FOLATE, FERRITIN, TIBC, IRON, RETICCTPCT in the last 72 hours. Sepsis Labs: No results for input(s): PROCALCITON, LATICACIDVEN in the last 168 hours.  No results found for this or any previous visit (from the past 240 hours).       Radiology Studies: No results found.       Scheduled Meds:  aspirin  EC  81 mg Oral BID   ferrous sulfate   325 mg Oral Q breakfast   lisinopril   20 mg Oral Daily   And   hydrochlorothiazide   25 mg Oral Daily   lidocaine   1 patch Transdermal Q24H   pantoprazole   40 mg Oral QHS   sertraline   50 mg Oral QHS   simvastatin   40 mg Oral QHS   Continuous Infusions:     LOS: 4 days    Time spent: 48 minutes spent on 10/11/2024 caring for this patient face-to-face including chart review, ordering labs/tests, documenting, discussion with nursing staff, consultants, updating family and interview/physical exam    Camellia PARAS Chelsey Calderone, DO [facility] Available via Epic secure chat 7am-7pm After these hours, please refer to coverage provider listed on [email or web address] 10/11/2024, 10:54 AM   "

## 2024-10-12 DIAGNOSIS — S72001A Fracture of unspecified part of neck of right femur, initial encounter for closed fracture: Secondary | ICD-10-CM | POA: Diagnosis not present

## 2024-10-12 MED ORDER — OXYCODONE HCL 5 MG PO TABS: 5.0000 mg | ORAL_TABLET | Freq: Four times a day (QID) | ORAL | 0 refills | Status: AC | PRN

## 2024-10-12 MED ORDER — ASPIRIN 81 MG PO TBEC: 81.0000 mg | DELAYED_RELEASE_TABLET | Freq: Two times a day (BID) | ORAL | 0 refills | Status: AC

## 2024-10-12 MED ORDER — DOCUSATE SODIUM 100 MG PO CAPS: 100.0000 mg | ORAL_CAPSULE | Freq: Two times a day (BID) | ORAL | 0 refills | Status: AC

## 2024-10-12 NOTE — Progress Notes (Signed)
 Assessment unchanged. Pt and son verbalized understanding of dc instructions including medications to resume, follow up care and when to call the MD. All questions answered. Discharged via wc to front entrance accompanied by Son and NT.

## 2024-10-12 NOTE — TOC Progression Note (Signed)
 Transition of Care [facility]) - Progression Note    Patient Details  Name: Chelsey Haas MRN: [id number] Date of Birth: 27-Nov-1940  Transition of [facility]) CM/SW Contact  Sonda Manuella Quill, RN Phone Number: 10/12/2024, 9:44 AM  Clinical Narrative:    Notified pt's son [name] requested to discuss [facility] services; called him at [phone]; explained [facility] will provide HHPT./OT, and agency will make contact to set up start of services; also explained agency contact info placed in follow up provider section of d/c instructions; LVM for [name] at [facility]; no IP CM needs.     Barriers to Discharge: No Barriers Identified               Expected Discharge Plan and Services         Expected Discharge Date: 10/12/24               DME Arranged: Vannie rolling DME Agency: [employer] Date DME Agency Contacted: 10/10/24   Representative spoke with at DME Agency: 1000 HH Arranged: PT, OT HH Agency: [facility] Date HH Agency Contacted: 10/10/24 Time HH Agency Contacted: 1000 Representative spoke with at [facility] Agency: Burnard Bucks   Social Drivers of Health (SDOH) Interventions SDOH Screenings   Food Insecurity: No Food Insecurity (10/08/2024)  Housing: Unknown (10/08/2024)  Transportation Needs: No Transportation Needs (10/08/2024)  Utilities: Not At Risk (10/08/2024)  Financial Resource Strain: Low Risk  (12/12/2022)   Received [facility]  Physical Activity: Insufficiently Active (12/12/2022)   Received from [facility]  Social Connections: Socially Integrated (10/08/2024)  Stress: Stress Concern Present (12/12/2022)   Received from [facility]  Tobacco Use: Low Risk (10/08/2024)    Readmission Risk Interventions    10/10/2024    2:48 PM  Readmission Risk Prevention Plan  Post Dischage Appt Complete  Medication Screening Complete  Transportation Screening Complete

## 2024-10-12 NOTE — Discharge Summary (Signed)
 " Physician Discharge Summary  Chelsey Haas FMW:[id number] DOB: 26-Aug-1941 DOA: 10/07/2024  PCP: Pcp, No  Admit date: 10/07/2024 Discharge date: 10/12/2024  Admitted From: Home Disposition: Home with home health  Recommendations for Outpatient Follow-up:  Follow up with PCP in 1-2 weeks Follow-up with orthopedics, Dr. Edna in 2 weeks Continue aspirin  81 mg p.o. twice daily on discharge for postoperative DVT prophylaxis  Home Health: PT/OT Equipment/Devices: Walker, 3 N 1 bedside commode  Discharge Condition: Stable CODE STATUS: Full code Diet recommendation: Heart healthy diet  History of present illness:  Chelsey Haas is a 84 y.o. female with past medical history significant for HTN, HLD, CVA, GERD, vertigo who presented to [facility] ED on 10/07/2024 from home via EMS with right knee pain following mechanical fall at home.  Patient reports history of vertigo, usually occurring once/twice per week. When she woke up this morning she experienced some mild vertigo described as the room spinning.  As she was walking, she attempted to stabilize herself by holding onto her dresser however she was unable to hold on completely and fell on her right side. She was unable to get up due to pain in her right knee so EMS was called. She denies hitting her head or LOC. She endorses pain around her right knee and hip with movement but currently denies any pain in the area after receiving pain medication.  He has not had any recent illnesses or reports she has lost a significant amount of weight over the last 2 years due to grieving multiple siblings that passed away.    In the ED, temperature 98.3 F, HR 70, RR 16, BP 110/56, SpO2 98% room air.  WBC 10.2, hemoglobin 11.9, platelet count 253.  Sodium 140, potassium 3.7, chloride 102, CO2 26, glucose 106, BUN 19, creatinine 1.28.  CT head/C-spine with no acute intracranial normality, no evidence of acute traumatic injury.  CT of right hip with  acute mildly impacted, anatomically aligned subcapital right femoral neck fracture without dislocation, remote healed fracture right superior and inferior pubic rami, severe sigmoid diverticulosis.  Orthopedics consulted.  TRH consulted for admission for further evaluation and management of right femoral neck fracture.  Hospital course:  Acute impacted right femoral neck fracture Patient presenting to ED follow mechanical fall at home with associated right knee pain.  CT right hip with acute mildly impacted anatomically aligned subcapital right femoral neck fracture without dislocation.  Orthopedics was consulted and patient underwent ORIF with screw fixation by Dr. Edna on 10/08/2024.  Weight-bear as tolerated to right lower extremity.  Continue aspirin  81 mg p.o. twice daily for postoperative DVT prophylaxis.  Seen by PT and OT with recommendation of home health, rolling walker and 3N1 bedside commode.  Follow up with orthopedics 2 weeks for wound check   Hypokalemia Repleted.     HTN Continue hydrochlorothiazide  25 mg p.o. daily, Lisinopril  20 g p.o. daily   HLD Simvastatin  40 mg p.o. daily   GERD Protonix  40 mg p.o. daily   Depression Sertraline  50 mg p.o. nightly   Vertigo Takes meclizine  as needed at home.  Discharge Diagnoses:  Principal Problem:   Fracture of femoral neck, right, closed (HCC) Active Problems:   Essential hypertension   Vertigo   CKD stage 3a, GFR 45-59 ml/min (HCC)   Iron deficiency anemia   History of CVA (cerebrovascular accident)   Fall    Discharge Instructions  Discharge Instructions     Call MD for:  difficulty breathing, headache or visual disturbances  Complete by: As directed    Call MD for:  extreme fatigue   Complete by: As directed    Call MD for:  persistant dizziness or light-headedness   Complete by: As directed    Call MD for:  persistant nausea and vomiting   Complete by: As directed    Call MD for:  severe  uncontrolled pain   Complete by: As directed    Call MD for:  temperature >100.4   Complete by: As directed    Increase activity slowly   Complete by: As directed    Leave dressing on - Keep it clean, dry, and intact until clinic visit   Complete by: As directed       Allergies as of 10/12/2024   No Known Allergies      Medication List     TAKE these medications    acetaminophen  500 MG tablet Commonly known as: TYLENOL  Take 1,000 mg by mouth every 6 (six) hours as needed for mild pain (pain score 1-3).   aspirin  EC 81 MG tablet Take 1 tablet (81 mg total) by mouth 2 (two) times daily for 28 days. Swallow whole.   docusate sodium  100 MG capsule Commonly known as: COLACE Take 1 capsule (100 mg total) by mouth 2 (two) times daily.   ferrous sulfate  325 (65 FE) MG EC tablet Take 1 tablet by mouth every morning.   lisinopril -hydrochlorothiazide  20-25 MG tablet Commonly known as: ZESTORETIC  Take 1 tablet by mouth at bedtime.   meclizine  12.5 MG tablet Commonly known as: ANTIVERT  Take 6.25 mg by mouth 3 (three) times daily as needed.   MULTIVITAMIN PO Take 1 tablet by mouth daily.   ondansetron  4 MG tablet Commonly known as: Zofran  Take 1 tablet (4 mg total) by mouth every 8 (eight) hours as needed for up to 14 days for nausea or vomiting. What changed: when to take this   oxyCODONE  5 MG immediate release tablet Commonly known as: Roxicodone  Take 1 tablet (5 mg total) by mouth every 6 (six) hours as needed for severe pain (pain score 7-10) or moderate pain (pain score 4-6).   pantoprazole  40 MG tablet Commonly known as: PROTONIX  Take 40 mg by mouth at bedtime.   rosuvastatin 40 MG tablet Commonly known as: CRESTOR Take 40 mg by mouth daily.   sertraline  50 MG tablet Commonly known as: ZOLOFT  Take 50 mg by mouth daily.   simvastatin  40 MG tablet Commonly known as: ZOCOR  Take 40 mg by mouth at bedtime.   Voltaren  Arthritis Pain 1 % Gel Generic drug:  diclofenac  Sodium Apply 2 g topically 4 (four) times daily as needed (joint pain).               Durable Medical Equipment  (From admission, onward)           Start     Ordered   10/09/24 1305  For home use only DME Walker rolling  Once       Question Answer Comment  Walker: With 5 Inch Wheels   Patient needs a walker to treat with the following condition Gait abnormality      10/09/24 1304   10/09/24 1305  For home use only DME 3 n 1  Once        10/09/24 1304              Discharge Care Instructions  (From admission, onward)           Start  Ordered   10/12/24 0000  Leave dressing on - Keep it clean, dry, and intact until clinic visit        10/12/24 0935            Contact information for follow-up providers     Edna Toribio LABOR, MD Follow up in 2 week(s).   Specialty: Orthopedic Surgery Contact information: [address] [place] [address] [phone]              Contact information for after-discharge care     Home Medical Care     [facility] [place] [facility]) .   Service: Home Health Services Contact information: [address] [place]  [address] [phone]                    Allergies[1]  Consultations: Orthopedics, Dr. Edna    Procedures/Studies: DG HIP UNILAT W OR W/O PELVIS 2-3 VIEWS RIGHT Result Date: 10/08/2024 CLINICAL DATA:  Fracture, postop. EXAM: DG HIP (WITH OR WITHOUT PELVIS) 2-3V RIGHT COMPARISON:  Preoperative imaging FINDINGS: Three screws traverse the right femoral neck. Stable alignment of femoral neck fracture. Remote right pubic rami fracture. Recent postsurgical change includes air and edema in the soft tissues. IMPRESSION: ORIF of right femoral neck fracture. No immediate postoperative complication. Electronically Signed   By: Andrea Gasman M.D.   On: 10/08/2024 16:46   DG FEMUR, MIN 2 VIEWS RIGHT Result Date:  10/08/2024 CLINICAL DATA:  Elective surgery. EXAM: RIGHT FEMUR 2 VIEWS COMPARISON:  Preoperative imaging FINDINGS: Twelve fluoroscopic spot views of the right proximal femur submitted from the operating room. Three screws traverse femoral neck fracture. Fluoroscopy time 39 seconds. Dose 10.02 mGy. IMPRESSION: Intraoperative fluoroscopy during right femoral neck fracture fixation. Electronically Signed   By: Andrea Gasman M.D.   On: 10/08/2024 16:35   DG C-Arm 1-60 Min-No Report Result Date: 10/08/2024 Fluoroscopy was utilized by the requesting physician.  No radiographic interpretation.   CT HIP RIGHT WO CONTRAST Result Date: 10/07/2024 EXAM: CT OF THE RIGHT HIP WITHOUT IV CONTRAST 10/07/2024 09:53:47 PM TECHNIQUE: CT of the right hip was performed without the administration of intravenous contrast. Multiplanar reformatted images are provided for review. Automated exposure control, iterative reconstruction, and/or weight based adjustment of the mA/kV was utilized to reduce the radiation dose to as low as reasonably achievable. COMPARISON: None available. CLINICAL HISTORY: Fracture, hip FINDINGS: BONES: There is an acute, mildly impacted anatomically aligned subcapital right femoral neck fracture. No dislocation. Remote healed fracture of the right superior and inferior pubic rami. No aggressive appearing osseous abnormality or periostitis. SOFT TISSUE: No significant soft tissue edema or fluid collections. No soft tissue mass. 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 No osseous erosions. INTRAPELVIC CONTENTS: Severe sigmoid diverticulosis incidentally noted. IMPRESSION: 1. Acute, mildly impacted, anatomically aligned subcapital right femoral neck fracture. No dislocation. 2. Remote healed fracture of the right superior and inferior pubic rami. 3. Severe sigmoid diverticulosis. Electronically signed by: Dorethia Molt MD 10/07/2024 11:57 PM EST RP Workstation: [id number]    DG Hip Unilat W or Wo Pelvis 2-3 Views Right Result Date: 10/07/2024 EXAM: 2 OR MORE VIEW(S) XRAY OF THE RIGHT HIP 10/07/2024 08:26:29 PM COMPARISON: None available. CLINICAL HISTORY: fall, fx on femur 1v FINDINGS: BONES AND JOINTS: Nondisplaced right femoral neck fracture. Old healed right superior and inferior pubic rami fractures. No malalignment. SOFT TISSUES: Surgical clips overlie the left pelvis. LINES AND TUBES: IVC  filter noted. IMPRESSION: 1. Nondisplaced right femoral neck fracture. 2. Old healed right superior and inferior pubic rami fractures. Electronically signed by: Greig Pique MD 10/07/2024 09:43 PM EST RP Workstation: [id number]   DG Femur 1V Right Result Date: 10/07/2024 EXAM: _Views_ VIEW(S) XRAY OF THE RIGHT FEMUR 10/07/2024 06:27:00 PM COMPARISON: 06/21/2023 CLINICAL HISTORY: post fall FINDINGS: BONES AND JOINTS: Healed right superior and inferior pubic rami fractures. New nondisplaced right femoral neck fracture. Degenerative changes of the right knee. No malalignment. SOFT TISSUES: The soft tissues are unremarkable. IMPRESSION: 1. New nondisplaced right femoral neck fracture. 2. Healed right superior and inferior pubic rami fractures. 3. Degenerative changes of the right knee. Electronically signed by: Greig Pique MD 10/07/2024 07:52 PM EST RP Workstation: [id number]   DG Tibia/Fibula Right Result Date: 10/07/2024 EXAM: 3 VIEW(S) XRAY OF THE LATERALITY TIBIA AND FIBULA 10/07/2024 06:27:00 PM COMPARISON: None available. CLINICAL HISTORY: post fall FINDINGS: BONES AND JOINTS: No acute fracture. No malalignment. SOFT TISSUES: The soft tissues are unremarkable. IMPRESSION: 1. No acute fracture or dislocation. Electronically signed by: Greig Pique MD 10/07/2024 07:51 PM EST RP Workstation: [id number]   DG Knee Complete 4 Views Right Result Date: 10/07/2024 EXAM: 4 VIEW(S) XRAY OF THE KNEE 10/07/2024 06:27:00 PM COMPARISON: None available. CLINICAL HISTORY: knee pain post fall  FINDINGS: BONES AND JOINTS: No acute fracture. No malalignment. No significant joint effusion. 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 Mild osteoarthritis of the patellofemoral compartment. SOFT TISSUES: The soft tissues are unremarkable. IMPRESSION: 1. No acute fracture or dislocation. 2. Moderate tricompartmental osteoarthritis, most pronounced within the lateral compartment. Electronically signed by: Greig Pique MD 10/07/2024 07:47 PM EST RP Workstation: [id number]   CT Cervical Spine Wo Contrast Result Date: 10/07/2024 EXAM: CT CERVICAL SPINE WITHOUT CONTRAST 10/07/2024 06:17:47 PM TECHNIQUE: CT of the cervical spine was performed without the administration of intravenous contrast. Multiplanar reformatted images are provided for review. Automated exposure control, iterative reconstruction, and/or weight based adjustment of the mA/kV was utilized to reduce the radiation dose to as low as reasonably achievable. COMPARISON: 06/21/2023 CLINICAL HISTORY: Neck trauma (Age >= 65y) FINDINGS: BONES AND ALIGNMENT: Trace degenerative retrolisthesis of C4 on C5. No evidence of traumatic malalignment. No compression fracture or displaced fracture in the cervical spine. Diffuse osteopenia. DEGENERATIVE CHANGES: Disc space narrowing most pronounced at C4-C5 and C5-C6 with associated degenerative endplate changes. Facet arthrosis and uncovertebral hypertrophy at multiple levels. Foraminal stenosis most pronounced on the right at C4-C5 and C5-C6. No high grade osseous spinal canal stenosis. SOFT TISSUES: No prevertebral soft tissue swelling. IMPRESSION: 1. No evidence of acute traumatic injury. Electronically signed by: Donnice Mania MD 10/07/2024 07:15 PM EST RP Workstation: [id number]   CT Head Wo Contrast Result Date: 10/07/2024 EXAM: CT HEAD WITHOUT CONTRAST 10/07/2024 06:17:47 PM TECHNIQUE: CT of the head was performed without the administration of intravenous  contrast. Automated exposure control, iterative reconstruction, and/or weight based adjustment of the mA/kV was utilized to reduce the radiation dose to as low as reasonably achievable. COMPARISON: 06/21/2023 CLINICAL HISTORY: Head trauma, minor (Age >= 65y) FINDINGS: BRAIN AND VENTRICLES: No acute hemorrhage. No evidence of acute infarct. 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 Additional small remote infarct in the right cerebellum is unchanged. Ex vacuo dilatation of left lateral ventricle. No hydrocephalus. No extra-axial collection. No mass effect or midline shift. ORBITS: No acute abnormality. SINUSES:  No acute abnormality. SOFT TISSUES AND SKULL: No acute soft tissue abnormality. No skull fracture. IMPRESSION: 1. No acute intracranial abnormality. Electronically signed by: Donnice Mania MD 10/07/2024 07:07 PM EST RP Workstation: [id number]     Subjective: Patient seen examined bedside, sitting in bedside chair.  Discharging home.  Updated patient's Chelsey Haas via telephone who requested callback from case management.  Patient with no other questions or concerns at this time.  Denies headache, no dizziness, no chest pain, no palpitation, no shortness of breath, no abdominal pain, no fever/chills/night sweats, no nausea/vomiting/diarrhea, no focal weakness, no fatigue, no paresthesia.  No acute events overnight per nursing staff.  Discharge Exam: Vitals:   10/11/24 2044 10/12/24 0547  BP: (!) 136/59 (!) 145/74  Pulse: 85 72  Resp: 15 15  Temp: 97.6 F (36.4 C) 97.9 F (36.6 C)  SpO2: 100% 98%   Vitals:   10/11/24 0537 10/11/24 1400 10/11/24 2044 10/12/24 0547  BP: (!) 91/49 (!) 122/56 (!) 136/59 (!) 145/74  Pulse: 65 80 85 72  Resp: 15 18 15 15   Temp: 97.9 F (36.6 C) 98.4 F (36.9 C) 97.6 F (36.4 C) 97.9 F (36.6 C)   TempSrc: Oral  Oral Oral  SpO2: 95% 99% 100% 98%  Weight:      Height:        Physical Exam: GEN: NAD, alert and oriented x 3, wd/wn HEENT: NCAT, PERRL, EOMI, sclera clear, MMM PULM: CTAB w/o wheezes/crackles, normal respiratory effort, on room air with SpO2 97% at rest CV: RRR w/o M/G/R GI: abd soft, NTND, NABS, no R/G/M MSK: no peripheral edema, right hip surgical incision site with dressing in place, clean/dry/intact  NEURO: No focal neurological deficit PSYCH: normal mood/affect Integumentary: dry/intact, no rashes or wounds    The results of significant diagnostics from this hospitalization (including imaging, microbiology, ancillary and laboratory) are listed below for reference.     Microbiology: No results found for this or any previous visit (from the past 240 hours).   Labs: BNP (last 3 results) No results for input(s): BNP in the last 8760 hours. Basic Metabolic Panel: Recent Labs  Lab 10/07/24 1800 10/08/24 0539 10/09/24 0506  NA 140 140 136  K 3.7 3.1* 3.6  CL 102 102 97*  CO2 26 26 24   GLUCOSE 106* 79 99  BUN 19 20 19   CREATININE 1.28* 1.23* 1.30*  CALCIUM 10.1 9.6 9.5  MG  --   --  1.8   Liver Function Tests: No results for input(s): AST, ALT, ALKPHOS, BILITOT, PROT, ALBUMIN in the last 168 hours. No results for input(s): LIPASE, AMYLASE in the last 168 hours. No results for input(s): AMMONIA in the last 168 hours. CBC: Recent Labs  Lab 10/07/24 1800 10/08/24 0539 10/09/24 0506  WBC 10.2 5.5 6.2  NEUTROABS 9.1*  --   --   HGB 11.9* 9.7* 10.1*  HCT 36.8 29.5* 29.0*  MCV 91.5 89.7 88.7  PLT 253 217 237   Cardiac Enzymes: No results for input(s): CKTOTAL, CKMB, CKMBINDEX, TROPONINI in the last 168 hours. BNP: Invalid input(s): POCBNP CBG: No results for input(s): GLUCAP in the last 168 hours. D-Dimer No results for input(s): DDIMER in the last 72 hours. Hgb A1c No results for input(s): HGBA1C in  the last 72 hours. Lipid Profile No results for input(s): CHOL, HDL, LDLCALC, TRIG, CHOLHDL, LDLDIRECT in the last 72 hours. Thyroid function studies No results for input(s): TSH, T4TOTAL, T3FREE, THYROIDAB in the last 72 hours.  Invalid input(s):  FREET3 Anemia work up No results for input(s): VITAMINB12, FOLATE, FERRITIN, TIBC, IRON, RETICCTPCT in the last 72 hours. Urinalysis    Component Value Date/Time   COLORURINE YELLOW 12/10/2014 1443   APPEARANCEUR CLEAR 12/10/2014 1443   LABSPEC 1.013 12/10/2014 1443   PHURINE 7.5 12/10/2014 1443   GLUCOSEU NEGATIVE 12/10/2014 1443   HGBUR NEGATIVE 12/10/2014 1443   BILIRUBINUR NEGATIVE 12/10/2014 1443   KETONESUR NEGATIVE 12/10/2014 1443   PROTEINUR NEGATIVE 12/10/2014 1443   UROBILINOGEN 0.2 12/10/2014 1443   NITRITE NEGATIVE 12/10/2014 1443   LEUKOCYTESUR TRACE (A) 12/10/2014 1443   Sepsis Labs Recent Labs  Lab 10/07/24 1800 10/08/24 0539 10/09/24 0506  WBC 10.2 5.5 6.2   Microbiology No results found for this or any previous visit (from the past 240 hours).   Time coordinating discharge: Over 30 minutes  SIGNED:   Camellia PARAS Ina Poupard, DO  [facility] 10/12/2024, 9:35 AM     [1] No Known Allergies  "

## 2024-10-12 NOTE — Progress Notes (Signed)
 Mobility Specialist - Progress Note   10/12/24 0947  Mobility  Activity Ambulated with assistance  Level of Assistance Standby assist, set-up cues, supervision of patient - no hands on  Assistive Device Front wheel walker  Distance Ambulated (ft) 100 ft  Range of Motion/Exercises Active  RLE Weight Bearing Per Provider Order WBAT  Activity Response Tolerated well  Mobility visit 1 Mobility  Mobility Specialist Start Time (ACUTE ONLY) 0932  Mobility Specialist Stop Time (ACUTE ONLY) 0947  Mobility Specialist Time Calculation (min) (ACUTE ONLY) 15 min   Pt was found on recliner chair and agreeable to mobilize. C/o R knee pain. At EOS returned to recliner chair with all needs met. Call bell in reach and chair alarm on.   Erminio Leos,  Mobility Specialist Can be reached via Secure Chat

## 2024-10-12 NOTE — Plan of Care (Signed)
" °  Problem: Education: Goal: Knowledge of General Education information will improve Description: Including pain rating scale, medication(s)/side effects and non-pharmacologic comfort measures Outcome: Adequate for Discharge   Problem: Health Behavior/Discharge Planning: Goal: Ability to manage health-related needs will improve Outcome: Adequate for Discharge   Problem: Clinical Measurements: Goal: Ability to maintain clinical measurements within normal limits will improve Outcome: Adequate for Discharge Goal: Will remain free from infection Outcome: Adequate for Discharge Goal: Diagnostic test results will improve Outcome: Adequate for Discharge Goal: Respiratory complications will improve Outcome: Adequate for Discharge Goal: Cardiovascular complication will be avoided Outcome: Adequate for Discharge   Problem: Activity: Goal: Risk for activity intolerance will decrease Outcome: Adequate for Discharge   Problem: Nutrition: Goal: Adequate nutrition will be maintained Outcome: Adequate for Discharge   Problem: Coping: Goal: Level of anxiety will decrease Outcome: Adequate for Discharge   Problem: Elimination: Goal: Will not experience complications related to bowel motility Outcome: Adequate for Discharge Goal: Will not experience complications related to urinary retention Outcome: Adequate for Discharge   Problem: Pain Managment: Goal: General experience of comfort will improve and/or be controlled Outcome: Adequate for Discharge   Problem: Safety: Goal: Ability to remain free from injury will improve Outcome: Adequate for Discharge   Problem: Skin Integrity: Goal: Risk for impaired skin integrity will decrease Outcome: Adequate for Discharge   Problem: Education: Goal: Knowledge of the prescribed therapeutic regimen will improve Outcome: Adequate for Discharge   Problem: Bowel/Gastric: Goal: Gastrointestinal status for postoperative course will  improve Outcome: Adequate for Discharge   Problem: Cardiac: Goal: Ability to maintain an adequate cardiac output Outcome: Adequate for Discharge Goal: Will show no evidence of cardiac arrhythmias Outcome: Adequate for Discharge   Problem: Nutritional: Goal: Will attain and maintain optimal nutritional status Outcome: Adequate for Discharge   Problem: Neurological: Goal: Will regain or maintain usual level of consciousness Outcome: Adequate for Discharge   Problem: Clinical Measurements: Goal: Ability to maintain clinical measurements within normal limits Outcome: Adequate for Discharge Goal: Postoperative complications will be avoided or minimized Outcome: Adequate for Discharge   Problem: Respiratory: Goal: Will regain and/or maintain adequate ventilation Outcome: Adequate for Discharge Goal: Respiratory status will improve Outcome: Adequate for Discharge   Problem: Skin Integrity: Goal: Demonstrates signs of wound healing without infection Outcome: Adequate for Discharge   Problem: Urinary Elimination: Goal: Will remain free from infection Outcome: Adequate for Discharge Goal: Ability to achieve and maintain adequate urine output Outcome: Adequate for Discharge   Problem: Acute Rehab PT Goals(only PT should resolve) Goal: Pt Will Go Supine/Side To Sit Outcome: Adequate for Discharge Goal: Patient Will Transfer Sit To/From Stand Outcome: Adequate for Discharge Goal: Pt Will Ambulate Outcome: Adequate for Discharge Goal: Pt Will Go Up/Down Stairs Outcome: Adequate for Discharge   Problem: Acute Rehab OT Goals (only OT should resolve) Goal: Pt. Will Perform Grooming Outcome: Adequate for Discharge Goal: Pt. Will Perform Lower Body Bathing Outcome: Adequate for Discharge Goal: Pt. Will Perform Lower Body Dressing Outcome: Adequate for Discharge Goal: Pt. Will Transfer To Toilet Outcome: Adequate for Discharge Goal: Pt. Will Perform Toileting-Clothing  Manipulation Outcome: Adequate for Discharge   "
